# Patient Record
Sex: Male | Born: 2005 | Race: Black or African American | Hispanic: No | Marital: Single | Smoking: Never smoker
Health system: Southern US, Community
[De-identification: ages and names within clinical notes are randomized; demographics above are authoritative.]

## PROBLEM LIST (undated history)

## (undated) DIAGNOSIS — H9325 Central auditory processing disorder: Secondary | ICD-10-CM

## (undated) DIAGNOSIS — R454 Irritability and anger: Secondary | ICD-10-CM

## (undated) DIAGNOSIS — R569 Unspecified convulsions: Secondary | ICD-10-CM

## (undated) DIAGNOSIS — F909 Attention-deficit hyperactivity disorder, unspecified type: Secondary | ICD-10-CM

## (undated) DIAGNOSIS — E669 Obesity, unspecified: Secondary | ICD-10-CM

## (undated) HISTORY — DX: Attention-deficit hyperactivity disorder, unspecified type: F90.9

## (undated) HISTORY — DX: Central auditory processing disorder: H93.25

## (undated) HISTORY — DX: Obesity, unspecified: E66.9

---
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
## 2023-02-12 ENCOUNTER — Ambulatory Visit
Admission: RE | Admit: 2023-02-12 | Discharge: 2023-02-12 | Disposition: A | Discharge status: Home / Self Care | Payer: BC Managed Care – PPO | Source: Ambulatory Visit | Attending: Family Medicine | Admitting: Family Medicine

## 2023-02-12 ENCOUNTER — Ambulatory Visit: Payer: BC Managed Care – PPO

## 2023-02-12 VITALS — BP 161/110 | HR 93 | Temp 97.7°F | Resp 16

## 2023-02-12 DIAGNOSIS — R1030 Lower abdominal pain, unspecified: Secondary | ICD-10-CM | POA: Diagnosis not present

## 2023-02-12 DIAGNOSIS — K219 Gastro-esophageal reflux disease without esophagitis: Secondary | ICD-10-CM | POA: Diagnosis not present

## 2023-02-12 DIAGNOSIS — K5900 Constipation, unspecified: Secondary | ICD-10-CM

## 2023-02-12 DIAGNOSIS — R112 Nausea with vomiting, unspecified: Secondary | ICD-10-CM

## 2023-02-12 MED ORDER — POLYETHYLENE GLYCOL 3350 17 GM/SCOOP PO POWD: 17.0000 g | Freq: Every day | ORAL | 0 refills | Status: DC

## 2023-02-12 MED ORDER — OMEPRAZOLE 40 MG PO CPDR: DELAYED_RELEASE_CAPSULE | ORAL | 1 refills | Status: DC

## 2023-02-12 NOTE — Discharge Instructions (Signed)
Read attached information sheets.  If symptoms become significantly worse during the night or over the weekend, proceed to the local emergency room.

## 2023-02-12 NOTE — ED Provider Notes (Signed)
[facility]    CSN: [id number] Arrival date & time: 02/12/23  1631      History   Chief Complaint Chief Complaint  Patient presents with   Abdominal Pain    Constant pain then when eat or drink get a stronger pain with nausea and feeling of having to go do #2. Haven't eaten fully for 2 days - Entered by patient    HPI Lee Haynes is a 17 y.o. male.   Patient complains of six day history of epigastric pain and sensation of hunger; however, eating causes increased bloating and discomfort. During the past 3 days he has had nausea and occasional vomiting. His last bowel movement was three days ago, although has a frequent urge to have a bowel movement.  Prior to his present symptoms his bowel movements were normal.  He denies melena and hematochezia.  He denies urinary symptoms and fevers, chills, and sweats.  The history is provided by the patient.  Abdominal Pain Pain location:  Epigastric Pain quality: bloating and fullness   Pain radiates to:  Does not radiate Pain severity:  Moderate Onset quality:  Sudden Duration:  6 days Timing:  Intermittent Progression:  Worsening Chronicity:  New Context: awakening from sleep and eating   Context: not diet changes, not previous surgeries, not recent illness, not recent travel, not sick contacts and not suspicious food intake   Relieved by:  Nothing Worsened by:  Eating Ineffective treatments: Pepto-bismol. Associated symptoms: belching, constipation, fatigue, nausea and vomiting   Associated symptoms: no chest pain, no chills, no cough, no diarrhea, no dysuria, no fever, no flatus, no hematemesis, no hematochezia, no hematuria, no melena and no shortness of breath   Risk factors: obesity     Past Medical History:  Diagnosis Date   ADHD (attention deficit hyperactivity disorder)    Anger    Central auditory processing disorder (CAPD)    Obesity    Seizures (HCC)    Had a single seizure 04/2009    Patient  Active Problem List   Diagnosis Date Noted   On methotrexate therapy 08/28/2020   Seborrheic dermatitis 08/28/2020   Essential hypertension 10/17/2019   Insulin resistance 08/20/2018   Mixed hyperlipidemia 08/20/2018   Peritonsillitis 04/11/2014   Acanthosis nigricans 07/20/2013   Attention deficit hyperactivity disorder (ADHD) 07/13/2013   Intermittent explosive disorder 07/13/2013    History reviewed. No pertinent surgical history.     Home Medications    Prior to Admission medications   Medication Sig Start Date End Date Taking? Authorizing Provider  omeprazole (PRILOSEC) 40 MG capsule Take one cap PO once daily before dinner 02/12/23  Yes Lashan Macias, Tera Mater, MD  polyethylene glycol powder (MIRALAX) 17 GM/SCOOP powder Take 17 g by mouth daily. Mix in 4 - 8 oz of liquid. 02/12/23  Yes Lattie Haw, MD  clobetasol (TEMOVATE) 0.05 % external solution Apply topically to affected areas of SCALP, not hair, daily as needed (when rough, scaly patches are present). DO NOT USE ON FACE. 08/28/20   [provider]  desonide (DESOWEN) 0.05 % ointment Apply topically. 08/31/20   [provider]  folic acid (FOLVITE) 1 MG tablet Take 1 mg by mouth daily. Patient not taking: Reported on 05/08/2021 08/28/20   [provider]  hydrocortisone 2.5 % ointment  04/14/18   [provider]  ketoconazole (NIZORAL) 2 % shampoo  04/14/18   [provider]  methotrexate (RHEUMATREX) 2.5 MG tablet Take 3 tablets (7.5 mg)  on Sunday and 3 tablets (7.5 mg) on Monday for a weekly total of 15 mg. Patient not taking: Reported on 05/08/2021 08/28/20   [provider]  Omega-3 Fatty Acids (FISH OIL PO) Take by mouth. Patient not taking: Reported on 05/08/2021    [provider]  sertraline (ZOLOFT) 100 MG tablet  12/04/17   [provider]  sertraline (ZOLOFT) 50 MG tablet Take 50 mg by mouth daily.  Patient not taking: Reported on 10/17/2019 08/19/15    [provider]  trimethoprim-polymyxin b (POLYTRIM) ophthalmic solution 1 drop 3 (three) times daily. Patient not taking: Reported on 05/08/2021 08/17/20   [provider]    Family History Family History  Problem Relation Age of Onset   Cancer Paternal Grandmother        Died at 21   Stroke Maternal Grandfather    Stroke Maternal Grandmother        Died at 53   Depression Maternal Aunt    Paranoid behavior Maternal Aunt        Paranoid Schizophrenia   Learning disabilities Brother        Half brother   Developmental delay Brother        Half brother    Healthy Mother    Diabetes Father        T2DM, treated with insulin.  On dialysis, awaiting kidney transplant   Diabetes Paternal Grandfather    Autism Cousin        First cousin    Diabetes Paternal Aunt    Diabetes Paternal Uncle    Thyroid disease Neg Hx     Social History Social History   Tobacco Use   Smoking status: Never    Passive exposure: Yes   Smokeless tobacco: Never     Allergies   Patient has no known allergies.   Review of Systems Review of Systems  Constitutional:  Positive for activity change, appetite change and fatigue. Negative for chills, diaphoresis and fever.  HENT: Negative.    Eyes: Negative.   Respiratory:  Negative for cough and shortness of breath.   Cardiovascular:  Negative for chest pain.  Gastrointestinal:  Positive for abdominal pain, constipation, nausea and vomiting. Negative for diarrhea, flatus, hematemesis, hematochezia and melena.  Genitourinary:  Negative for dysuria and hematuria.  Musculoskeletal: Negative.   Skin: Negative.   Neurological:  Negative for headaches.  Hematological:  Negative for adenopathy.     Physical Exam Triage Vital Signs ED Triage Vitals  Encounter Vitals Group     BP 02/12/23 1718 (!) 161/110     Systolic BP Percentile --      Diastolic BP Percentile --      Pulse Rate 02/12/23 1718 93     Resp 02/12/23 1718 16      Temp 02/12/23 1718 97.7 F (36.5 C)     Temp src --      SpO2 02/12/23 1718 98 %     Weight --      Height --      Head Circumference --      Peak Flow --      Pain Score 02/12/23 1717 4     Pain Loc --      Pain Education --      Exclude from Growth Chart --    No data found.  Updated Vital Signs BP (!) 161/110   Pulse 93   Temp 97.7 F (36.5 C)   Resp 16   SpO2 98%  Visual Acuity Right Eye Distance:   Left Eye Distance:   Bilateral Distance:    Right Eye Near:   Left Eye Near:    Bilateral Near:     Physical Exam Vitals and nursing note reviewed.  Constitutional:      General: He is not in acute distress.    Appearance: He is not ill-appearing.  HENT:     Head: Normocephalic.     Mouth/Throat:     Mouth: Mucous membranes are moist.     Pharynx: Oropharynx is clear.  Eyes:     Extraocular Movements: Extraocular movements intact.     Conjunctiva/sclera: Conjunctivae normal.     Pupils: Pupils are equal, round, and reactive to light.  Cardiovascular:     Rate and Rhythm: Normal rate and regular rhythm.     Heart sounds: Normal heart sounds.  Pulmonary:     Effort: Pulmonary effort is normal.     Breath sounds: Normal breath sounds.  Abdominal:     General: Abdomen is protuberant. Bowel sounds are decreased. There is no distension.     Palpations: Abdomen is soft. There is no hepatomegaly, splenomegaly or mass.     Tenderness: There is abdominal tenderness in the epigastric area. There is no right CVA tenderness or left CVA tenderness. Negative signs include Murphy's sign.    Musculoskeletal:     Cervical back: Neck supple.     Right lower leg: No edema.     Left lower leg: No edema.  Lymphadenopathy:     Cervical: No cervical adenopathy.  Skin:    General: Skin is warm and dry.     Findings: No rash.  Neurological:     Mental Status: He is alert and oriented to person, place, and time.     UC Treatments / Results  Labs (all labs ordered are  listed, but only abnormal results are displayed) Labs Reviewed - No data to display  EKG   Radiology DG Abd 2 Views  Result Date: 02/12/2023 CLINICAL DATA:  Abdomen pain nausea and vomiting EXAM: ABDOMEN - 2 VIEW COMPARISON:  02/14/2014 FINDINGS: No free air beneath the diaphragm. Nonobstructed gas pattern with moderate stool burden. No radiopaque calculi. IMPRESSION: Nonobstructed gas pattern with moderate stool burden. Electronically Signed   By: Jasmine Pang M.D.   On: 02/12/2023 19:42    Procedures Procedures (including critical care time)  Medications Ordered in UC Medications - No data to display  Initial Impression / Assessment and Plan / UC Course  I have reviewed the triage vital signs and the nursing notes.  Pertinent labs & imaging results that were available during my care of the patient were reviewed by me and considered in my medical decision making (see chart for details).    2 view abdomen shows nonobstructed gas pattern with moderate stool burden. Suspect epigastric symptoms represent GERD. Begin omeprazole 40mg  daily and Miralax. Followup with Family Doctor if not improved in about two weeks.  Final Clinical Impressions(s) / UC Diagnoses   Final diagnoses:  Gastroesophageal reflux disease, unspecified whether esophagitis present  Constipation, unspecified constipation type     Discharge Instructions      Read attached information sheets.  If symptoms become significantly worse during the night or over the weekend, proceed to the local emergency room.      ED Prescriptions     Medication Sig Dispense Auth. Provider   omeprazole (PRILOSEC) 40 MG capsule Take one cap PO once daily before dinner 30  capsule Lattie Haw, MD   polyethylene glycol powder (MIRALAX) 17 GM/SCOOP powder Take 17 g by mouth daily. Mix in 4 - 8 oz of liquid. 255 g Lattie Haw, MD         Lattie Haw, MD 02/13/23 [id number]

## 2023-02-12 NOTE — ED Triage Notes (Signed)
Pt presents to uc with co of abd pain epigastric and left sided nausea, vomiting and decreased appetite for 4 days. Pt reports pepto. Pt reports no bowel movements since Monday.

## 2023-02-14 ENCOUNTER — Encounter (HOSPITAL_COMMUNITY): Payer: Self-pay

## 2023-02-14 ENCOUNTER — Inpatient Hospital Stay (HOSPITAL_COMMUNITY)
Admission: EM | Admit: 2023-02-14 | Discharge: 2023-02-19 | DRG: 439 | Disposition: A | Discharge status: Home / Self Care | Payer: BC Managed Care – PPO | Attending: Internal Medicine | Admitting: Internal Medicine

## 2023-02-14 ENCOUNTER — Emergency Department (HOSPITAL_COMMUNITY): Payer: BC Managed Care – PPO

## 2023-02-14 ENCOUNTER — Other Ambulatory Visit: Payer: Self-pay

## 2023-02-14 DIAGNOSIS — L409 Psoriasis, unspecified: Secondary | ICD-10-CM | POA: Diagnosis present

## 2023-02-14 DIAGNOSIS — K859 Acute pancreatitis without necrosis or infection, unspecified: Principal | ICD-10-CM | POA: Diagnosis present

## 2023-02-14 DIAGNOSIS — Z833 Family history of diabetes mellitus: Secondary | ICD-10-CM

## 2023-02-14 DIAGNOSIS — I10 Essential (primary) hypertension: Secondary | ICD-10-CM | POA: Diagnosis present

## 2023-02-14 DIAGNOSIS — R030 Elevated blood-pressure reading, without diagnosis of hypertension: Secondary | ICD-10-CM | POA: Insufficient documentation

## 2023-02-14 DIAGNOSIS — Z823 Family history of stroke: Secondary | ICD-10-CM

## 2023-02-14 DIAGNOSIS — E66813 Obesity, class 3: Secondary | ICD-10-CM | POA: Diagnosis present

## 2023-02-14 DIAGNOSIS — Z79899 Other long term (current) drug therapy: Secondary | ICD-10-CM | POA: Diagnosis not present

## 2023-02-14 DIAGNOSIS — Z6841 Body Mass Index (BMI) 40.0 and over, adult: Secondary | ICD-10-CM | POA: Diagnosis not present

## 2023-02-14 DIAGNOSIS — K8500 Idiopathic acute pancreatitis without necrosis or infection: Principal | ICD-10-CM | POA: Diagnosis present

## 2023-02-14 DIAGNOSIS — F909 Attention-deficit hyperactivity disorder, unspecified type: Secondary | ICD-10-CM | POA: Diagnosis present

## 2023-02-14 DIAGNOSIS — Z818 Family history of other mental and behavioral disorders: Secondary | ICD-10-CM | POA: Diagnosis not present

## 2023-02-14 LAB — CBC
HCT: 43.8 % (ref 39.0–52.0)
Hemoglobin: 15.1 g/dL (ref 13.0–17.0)
MCH: 28 pg (ref 26.0–34.0)
MCHC: 34.5 g/dL (ref 30.0–36.0)
MCV: 81.3 fL (ref 80.0–100.0)
Platelets: 319 10*3/uL (ref 150–400)
RBC: 5.39 MIL/uL (ref 4.22–5.81)
RDW: 12.6 % (ref 11.5–15.5)
WBC: 13.7 10*3/uL — ABNORMAL HIGH (ref 4.0–10.5)
nRBC: 0 % (ref 0.0–0.2)

## 2023-02-14 LAB — URINALYSIS, ROUTINE W REFLEX MICROSCOPIC
Bilirubin Urine: NEGATIVE
Glucose, UA: NEGATIVE mg/dL
Hgb urine dipstick: NEGATIVE
Ketones, ur: 20 mg/dL — AB
Leukocytes,Ua: NEGATIVE
Nitrite: NEGATIVE
Protein, ur: 100 mg/dL — AB
Specific Gravity, Urine: 1.029 (ref 1.005–1.030)
pH: 5 (ref 5.0–8.0)

## 2023-02-14 LAB — COMPREHENSIVE METABOLIC PANEL
ALT: 25 U/L (ref 0–44)
AST: 15 U/L (ref 15–41)
Albumin: 4.1 g/dL (ref 3.5–5.0)
Alkaline Phosphatase: 77 U/L (ref 38–126)
Anion gap: 11 (ref 5–15)
BUN: 10 mg/dL (ref 6–20)
CO2: 22 mmol/L (ref 22–32)
Calcium: 9.2 mg/dL (ref 8.9–10.3)
Chloride: 102 mmol/L (ref 98–111)
Creatinine, Ser: 1.03 mg/dL (ref 0.61–1.24)
GFR, Estimated: >60 mL/min (ref >60)
Glucose, Bld: 86 mg/dL (ref 70–99)
Potassium: 3.8 mmol/L (ref 3.5–5.1)
Sodium: 135 mmol/L (ref 135–145)
Total Bilirubin: 1 mg/dL (ref <1.2)
Total Protein: 8.3 g/dL — ABNORMAL HIGH (ref 6.5–8.1)

## 2023-02-14 LAB — TRIGLYCERIDES: Triglycerides: 76 mg/dL (ref <150)

## 2023-02-14 LAB — LIPASE, BLOOD: Lipase: 63 U/L — ABNORMAL HIGH (ref 11–51)

## 2023-02-14 MED ORDER — ONDANSETRON HCL 4 MG/2ML IJ SOLN
4.0000 mg | Freq: Once | INTRAMUSCULAR | Status: AC
  Administered 2023-02-14: 4 mg via INTRAVENOUS
  Filled 2023-02-14: qty 2

## 2023-02-14 MED ORDER — OXYCODONE HCL 5 MG PO TABS
5.0000 mg | ORAL_TABLET | ORAL | Status: DC | PRN
  Filled 2023-02-14: qty 1

## 2023-02-14 MED ORDER — MORPHINE SULFATE (PF) 4 MG/ML IV SOLN
6.0000 mg | Freq: Once | INTRAVENOUS | Status: AC
  Administered 2023-02-14: 6 mg via INTRAVENOUS
  Filled 2023-02-14: qty 2

## 2023-02-14 MED ORDER — PANTOPRAZOLE SODIUM 40 MG IV SOLR
40.0000 mg | Freq: Once | INTRAVENOUS | Status: AC
  Administered 2023-02-14: 40 mg via INTRAVENOUS
  Filled 2023-02-14: qty 10

## 2023-02-14 MED ORDER — ENOXAPARIN SODIUM 80 MG/0.8ML IJ SOSY
80.0000 mg | PREFILLED_SYRINGE | INTRAMUSCULAR | Status: DC
  Administered 2023-02-14 – 2023-02-18 (×5): 80 mg via SUBCUTANEOUS
  Filled 2023-02-14 (×5): qty 0.8

## 2023-02-14 MED ORDER — ONDANSETRON HCL 4 MG/2ML IJ SOLN
4.0000 mg | Freq: Four times a day (QID) | INTRAMUSCULAR | Status: DC | PRN
  Administered 2023-02-15 – 2023-02-17 (×4): 4 mg via INTRAVENOUS
  Filled 2023-02-14 (×5): qty 2

## 2023-02-14 MED ORDER — SODIUM CHLORIDE 0.9 % IV SOLN: INTRAVENOUS | Status: AC

## 2023-02-14 MED ORDER — ONDANSETRON HCL 4 MG PO TABS: 4.0000 mg | ORAL_TABLET | Freq: Four times a day (QID) | ORAL | Status: DC | PRN

## 2023-02-14 MED ORDER — IOHEXOL 300 MG/ML  SOLN
100.0000 mL | Freq: Once | INTRAMUSCULAR | Status: AC | PRN
  Administered 2023-02-14: 100 mL via INTRAVENOUS

## 2023-02-14 MED ORDER — HYDROMORPHONE HCL 1 MG/ML IJ SOLN
0.5000 mg | INTRAMUSCULAR | Status: DC | PRN
  Administered 2023-02-14 – 2023-02-18 (×22): 1 mg via INTRAVENOUS
  Administered 2023-02-19 (×2): 0.5 mg via INTRAVENOUS
  Filled 2023-02-14 (×25): qty 1

## 2023-02-14 MED ORDER — SODIUM CHLORIDE 0.9 % IV SOLN: INTRAVENOUS | Status: DC

## 2023-02-14 MED ORDER — ACETAMINOPHEN 650 MG RE SUPP: 650.0000 mg | Freq: Four times a day (QID) | RECTAL | Status: DC | PRN

## 2023-02-14 MED ORDER — ACETAMINOPHEN 325 MG PO TABS: 650.0000 mg | ORAL_TABLET | Freq: Four times a day (QID) | ORAL | Status: DC | PRN

## 2023-02-14 MED ORDER — LABETALOL HCL 5 MG/ML IV SOLN
10.0000 mg | INTRAVENOUS | Status: DC | PRN
  Administered 2023-02-15: 10 mg via INTRAVENOUS
  Filled 2023-02-14 (×2): qty 4

## 2023-02-14 MED ORDER — HYDROMORPHONE HCL 1 MG/ML IJ SOLN
1.0000 mg | Freq: Once | INTRAMUSCULAR | Status: AC
  Administered 2023-02-14: 1 mg via INTRAVENOUS
  Filled 2023-02-14: qty 1

## 2023-02-14 MED ORDER — SODIUM CHLORIDE 0.9 % IV BOLUS
1000.0000 mL | Freq: Once | INTRAVENOUS | Status: AC
  Administered 2023-02-14: 1000 mL via INTRAVENOUS

## 2023-02-14 NOTE — H&P (Signed)
History and Physical    Lee Haynes ZOX:[id number] DOB: Jul 16, 2005 DOA: 02/14/2023  PCP: Carmin Richmond, MD   Patient coming from: Home   Chief Complaint: Abdominal pain, N/V   HPI: Lee Haynes is a 17 y.o. male with medical history significant for psoriasis, ADHD, and BMI 50 who presents with abdominal pain, nausea, and vomiting.   Patient reports close to 1 week of epigastric pain with N/V.  Symptoms come and go, are much worse after eating, and overall becoming more frequent and severe. He has never experienced this previously. He denies alcohol use. There is no diarrhea, fever, or chills associated with this.   ED Course: Upon arrival to the ED, patient is found to be afebrile and saturating well on room air with stable blood pressure.  Labs are most notable for WBC 13,700 and lipase 63.  CT reveals edematous pancreas with soft tissue stranding.  Patient was given a liter of normal saline, Zofran, IV Protonix, Dilaudid, and morphine in the ED.  Review of Systems:  All other systems reviewed and apart from HPI, are negative.  Past Medical History:  Diagnosis Date   ADHD (attention deficit hyperactivity disorder)    Anger    Central auditory processing disorder (CAPD)    Obesity    Seizures (HCC)    Had a single seizure 04/2009    History reviewed. No pertinent surgical history.  Social History:   reports that he has never smoked. He has been exposed to tobacco smoke. He has never used smokeless tobacco. No history on file for alcohol use and drug use.  No Known Allergies  Family History  Problem Relation Age of Onset   Cancer Paternal Grandmother        Died at 3   Stroke Maternal Grandfather    Stroke Maternal Grandmother        Died at 87   Depression Maternal Aunt    Paranoid behavior Maternal Aunt        Paranoid Schizophrenia   Learning disabilities Brother        Half brother   Developmental delay Brother        Half brother    Healthy Mother     Diabetes Father        T2DM, treated with insulin.  On dialysis, awaiting kidney transplant   Diabetes Paternal Grandfather    Autism Cousin        First cousin    Diabetes Paternal Aunt    Diabetes Paternal Uncle    Thyroid disease Neg Hx      Prior to Admission medications   Medication Sig Start Date End Date Taking? Authorizing Provider  clobetasol (TEMOVATE) 0.05 % external solution Apply topically to affected areas of SCALP, not hair, daily as needed (when rough, scaly patches are present). DO NOT USE ON FACE. 08/28/20   [provider]  desonide (DESOWEN) 0.05 % ointment Apply topically. 08/31/20   [provider]  folic acid (FOLVITE) 1 MG tablet Take 1 mg by mouth daily. Patient not taking: Reported on 05/08/2021 08/28/20   [provider]  hydrocortisone 2.5 % ointment  04/14/18   [provider]  ketoconazole (NIZORAL) 2 % shampoo  04/14/18   [provider]  methotrexate (RHEUMATREX) 2.5 MG tablet Take 3 tablets (7.5 mg) on Sunday and 3 tablets (7.5 mg) on Monday for a weekly total of 15 mg. Patient not taking: Reported on 05/08/2021 08/28/20   [provider]  Omega-3 Fatty Acids (FISH OIL PO) Take by mouth. Patient not taking: Reported on 05/08/2021    [provider]  omeprazole (PRILOSEC) 40 MG capsule Take one cap PO once daily before dinner 02/12/23   Lattie Haw, MD  polyethylene glycol powder (MIRALAX) 17 GM/SCOOP powder Take 17 g by mouth daily. Mix in 4 - 8 oz of liquid. 02/12/23   Lattie Haw, MD  sertraline (ZOLOFT) 100 MG tablet  12/04/17   [provider]  sertraline (ZOLOFT) 50 MG tablet Take 50 mg by mouth daily.  Patient not taking: Reported on 10/17/2019 08/19/15   [provider]  trimethoprim-polymyxin b (POLYTRIM) ophthalmic solution 1 drop 3 (three) times daily. Patient not taking: Reported on 05/08/2021 08/17/20   [provider]    Physical Exam: Vitals:    02/14/23 1316 02/14/23 1320 02/14/23 1400 02/14/23 1500  BP: (!) 158/70  (!) 157/82 (!) 145/85  Pulse: (!) 108  94 76  Resp: 16   20  Temp: 99.5 F (37.5 C)     TempSrc: Oral     SpO2: 97%  100% 100%  Weight:  (!) 176 kg    Height:  6\' 2"  (1.88 m)       Constitutional: NAD, calm  Eyes: PERTLA, lids and conjunctivae normal ENMT: Mucous membranes are moist. Posterior pharynx clear of any exudate or lesions.   Neck: supple, no masses  Respiratory: clear to auscultation bilaterally, no wheezing, no crackles. No accessory muscle use.  Cardiovascular: S1 & S2 heard, regular rate and rhythm. No extremity edema. No significant JVD. Abdomen: No distension, no tenderness, soft. Bowel sounds active.  Musculoskeletal: no clubbing / cyanosis. No joint deformity upper and lower extremities.   Skin: no significant rashes, lesions, ulcers. Warm, dry, well-perfused. Neurologic: CN 2-12 grossly intact. Sensation intact, DTR normal. Strength 5/5 in all 4 limbs. Alert and oriented.  Psychiatric: Pleasant. Cooperative.    Labs and Imaging on Admission: I have personally reviewed following labs and imaging studies  CBC: Recent Labs  Lab 02/14/23 1336  WBC 13.7*  HGB 15.1  HCT 43.8  MCV 81.3  PLT 319   Basic Metabolic Panel: Recent Labs  Lab 02/14/23 1507  NA 135  K 3.8  CL 102  CO2 22  GLUCOSE 86  BUN 10  CREATININE 1.03  CALCIUM 9.2   GFR: Estimated Creatinine Clearance: 195.3 mL/min (by C-G formula based on SCr of 1.03 mg/dL). Liver Function Tests: Recent Labs  Lab 02/14/23 1507  AST 15  ALT 25  ALKPHOS 77  BILITOT 1.0  PROT 8.3*  ALBUMIN 4.1   Recent Labs  Lab 02/14/23 1507  LIPASE 63*   No results for input(s): "AMMONIA" in the last 168 hours. Coagulation Profile: No results for input(s): "INR", "PROTIME" in the last 168 hours. Cardiac Enzymes: No results for input(s): "CKTOTAL", "CKMB", "CKMBINDEX", "TROPONINI" in the last 168 hours. BNP (last 3 results) No  results for input(s): "PROBNP" in the last 8760 hours. HbA1C: No results for input(s): "HGBA1C" in the last 72 hours. CBG: No results for input(s): "GLUCAP" in the last 168 hours. Lipid Profile: No results for input(s): "CHOL", "HDL", "LDLCALC", "TRIG", "CHOLHDL", "LDLDIRECT" in the last 72 hours. Thyroid Function Tests: No results for input(s): "TSH", "T4TOTAL", "FREET4", "T3FREE", "THYROIDAB" in the last 72 hours. Anemia Panel: No results for input(s): "VITAMINB12", "FOLATE", "FERRITIN", "TIBC", "IRON", "RETICCTPCT" in the last 72 hours. Urine analysis:    Component Value Date/Time   COLORURINE AMBER (A)  02/14/2023 1348   APPEARANCEUR CLEAR 02/14/2023 1348   LABSPEC 1.029 02/14/2023 1348   PHURINE 5.0 02/14/2023 1348   GLUCOSEU NEGATIVE 02/14/2023 1348   HGBUR NEGATIVE 02/14/2023 1348   BILIRUBINUR NEGATIVE 02/14/2023 1348   KETONESUR 20 (A) 02/14/2023 1348   PROTEINUR 100 (A) 02/14/2023 1348   UROBILINOGEN 1.0 04/17/2009 1027   NITRITE NEGATIVE 02/14/2023 1348   LEUKOCYTESUR NEGATIVE 02/14/2023 1348   Sepsis Labs: @LABRCNTIP (procalcitonin:4,lacticidven:4) )No results found for this or any previous visit (from the past 240 hour(s)).   Radiological Exams on Admission: CT ABDOMEN PELVIS W CONTRAST  Result Date: 02/14/2023 CLINICAL DATA:  Abdominal pain.  Nausea and vomiting for 6 days. EXAM: CT ABDOMEN AND PELVIS WITH CONTRAST TECHNIQUE: Multidetector CT imaging of the abdomen and pelvis was performed using the standard protocol following bolus administration of intravenous contrast. RADIATION DOSE REDUCTION: This exam was performed according to the departmental dose-optimization program which includes automated exposure control, adjustment of the mA and/or kV according to patient size and/or use of iterative reconstruction technique. CONTRAST:  OMNIPAQUE IOHEXOL 300 MG/ML  SOLN COMPARISON:  None Available. FINDINGS: Lower chest: No acute abnormality. Hepatobiliary: No focal  liver abnormality is seen. No gallstones, gallbladder wall thickening, or biliary dilatation. Pancreas: The pancreas appears edematous. There is mild diffuse soft tissue stranding about the pancreatic tail. No signs of main duct dilatation, necrosis, pseudocyst or mass. Spleen: Normal in size without focal abnormality. Adrenals/Urinary Tract: Adrenal glands are unremarkable. Kidneys are normal, without renal calculi, focal lesion, or hydronephrosis. Bladder is unremarkable. Stomach/Bowel: Stomach appears normal. No dilated loops of large or small bowel. No bowel wall thickening or inflammation identified. Vascular/Lymphatic: The abdominal vascularity is patent. Normal caliber of the abdominal aorta. No signs of abdominopelvic adenopathy. Reproductive: Prostate is unremarkable. Other: No free fluid or fluid collections identified. No signs of pneumoperitoneum. Musculoskeletal: No acute or significant osseous findings. IMPRESSION: The pancreas appears edematous with mild diffuse soft tissue stranding about the pancreatic tail. Findings are compatible with acute pancreatitis. No signs of main duct dilatation, necrosis, pseudocyst or mass. Electronically Signed   By: Signa Kell M.D.   On: 02/14/2023 16:06   DG Abd 2 Views  Result Date: 02/12/2023 CLINICAL DATA:  Abdomen pain nausea and vomiting EXAM: ABDOMEN - 2 VIEW COMPARISON:  02/14/2014 FINDINGS: No free air beneath the diaphragm. Nonobstructed gas pattern with moderate stool burden. No radiopaque calculi. IMPRESSION: Nonobstructed gas pattern with moderate stool burden. Electronically Signed   By: Jasmine Pang M.D.   On: 02/12/2023 19:42     Assessment/Plan   1. Acute pancreatitis  - Check triglycerides, continue bowel-rest, IVF hydration, and pain-control   2. HTN  - Treat as-needed only for now     DVT prophylaxis: Lovenox  Code Status: Full  Level of Care: Level of care: Med-Surg Family Communication: Mother at bedside   Disposition  Plan:  Patient is from: home  Anticipated d/c is to: Home  Anticipated d/c date is: 02/17/23  Patient currently: Pending pain-control, tolerance of adequate oral intake  Consults called: None  Admission status: Inpatient     Briscoe Deutscher, MD [facility]  02/14/2023, 5:21 PM

## 2023-02-14 NOTE — ED Triage Notes (Signed)
Reports epigastric pain, nausea, and vomiting x 6 days. Seen at urgent care 2 days ago, was dx with GERD and constipation. Prescribed omeprazole and miralax. Has been unable to take d/t nausea. Patient also has not had a bowel movement.

## 2023-02-14 NOTE — ED Provider Notes (Addendum)
[place] EMERGENC[facility] DEPARTMENT AT [facility] Provider Note   CSN: [id number] Arrival date & time: 02/14/23  1309     History  Chief Complaint  Patient presents with   Abdominal Pain    Lee Haynes is a 17 y.o. male.  17year-old male presents with 6 days of abdominal pain.  Pain has been epigastric in nature.  Is worse when he takes anything orally.  History of reflux in the past but is using not associated with pain.  No urinary symptoms.  No use of cannabis.  No fever or chills.  Denies any diarrhea.  Seen at urgent care 2 days ago for similar symptoms.  Plain x-rays performed and only showed increased stool.  Mom notes patient has had good bowel movements.  No prior history of abdominal surgeries       Home Medications Prior to Admission medications   Medication Sig Start Date End Date Taking? Authorizing Provider  clobetasol (TEMOVATE) 0.05 % external solution Apply topically to affected areas of SCALP, not hair, daily as needed (when rough, scaly patches are present). DO NOT USE ON FACE. 08/28/20   [provider]  desonide (DESOWEN) 0.05 % ointment Apply topically. 08/31/20   [provider]  folic acid (FOLVITE) 1 MG tablet Take 1 mg by mouth daily. Patient not taking: Reported on 05/08/2021 08/28/20   [provider]  hydrocortisone 2.5 % ointment  04/14/18   [provider]  ketoconazole (NIZORAL) 2 % shampoo  04/14/18   [provider]  methotrexate (RHEUMATREX) 2.5 MG tablet Take 3 tablets (7.5 mg) on Sunday and 3 tablets (7.5 mg) on Monday for a weekly total of 15 mg. Patient not taking: Reported on 05/08/2021 08/28/20   [provider]  Omega-3 Fatty Acids (FISH OIL PO) Take by mouth. Patient not taking: Reported on 05/08/2021    [provider]  omeprazole (PRILOSEC) 40 MG capsule Take one cap PO once daily before dinner 02/12/23   Lattie Haw, MD  polyethylene glycol powder (MIRALAX) 17  GM/SCOOP powder Take 17 g by mouth daily. Mix in 4 - 8 oz of liquid. 02/12/23   Lattie Haw, MD  sertraline (ZOLOFT) 100 MG tablet  12/04/17   [provider]  sertraline (ZOLOFT) 50 MG tablet Take 50 mg by mouth daily.  Patient not taking: Reported on 10/17/2019 08/19/15   [provider]  trimethoprim-polymyxin b (POLYTRIM) ophthalmic solution 1 drop 3 (three) times daily. Patient not taking: Reported on 05/08/2021 08/17/20   [provider]      Allergies    Patient has no known allergies.    Review of Systems   Review of Systems  All other systems reviewed and are negative.   Physical Exam Updated Vital Signs BP (!) 158/70 (BP Location: Left Arm)   Pulse (!) 108   Temp 99.5 F (37.5 C) (Oral)   Resp 16   Ht 1.88 m (6\' 2" )   Wt (!) 176 kg   SpO2 97%   BMI 49.82 kg/m  Physical Exam Vitals and nursing note reviewed.  Constitutional:      General: He is not in acute distress.    Appearance: Normal appearance. He is well-developed. He is not toxic-appearing.  HENT:     Head: Normocephalic and atraumatic.  Eyes:     General: Lids are normal.     Conjunctiva/sclera: Conjunctivae normal.     Pupils: Pupils are equal, round, and reactive to light.  Neck:     Thyroid: No thyroid mass.     Trachea: No tracheal deviation.  Cardiovascular:     Rate and Rhythm: Normal rate and regular rhythm.     Heart sounds: Normal heart sounds. No murmur heard.    No gallop.  Pulmonary:     Effort: Pulmonary effort is normal. No respiratory distress.     Breath sounds: Normal breath sounds. No stridor. No decreased breath sounds, wheezing, rhonchi or rales.  Abdominal:     General: There is no distension.     Palpations: Abdomen is soft.     Tenderness: There is abdominal tenderness in the epigastric area. There is no rebound.    Musculoskeletal:        General: No tenderness. Normal range of motion.     Cervical back: Normal range of motion and neck supple.   Skin:    General: Skin is warm and dry.     Findings: No abrasion or rash.  Neurological:     Mental Status: He is alert and oriented to person, place, and time. Mental status is at baseline.     GCS: GCS eye subscore is 4. GCS verbal subscore is 5. GCS motor subscore is 6.     Cranial Nerves: Cranial nerves are intact. No cranial nerve deficit.     Sensory: No sensory deficit.     Motor: Motor function is intact.  Psychiatric:        Attention and Perception: Attention normal.        Speech: Speech normal.        Behavior: Behavior normal.     ED Results / Procedures / Treatments   Labs (all labs ordered are listed, but only abnormal results are displayed) Labs Reviewed  CBC - Abnormal; Notable for the following components:      Result Value   WBC 13.7 (*)    All other components within normal limits  LIPASE, BLOOD  COMPREHENSIVE METABOLIC PANEL  URINALYSIS, ROUTINE W REFLEX MICROSCOPIC    EKG None  Radiology DG Abd 2 Views  Result Date: 02/12/2023 CLINICAL DATA:  Abdomen pain nausea and vomiting EXAM: ABDOMEN - 2 VIEW COMPARISON:  02/14/2014 FINDINGS: No free air beneath the diaphragm. Nonobstructed gas pattern with moderate stool burden. No radiopaque calculi. IMPRESSION: Nonobstructed gas pattern with moderate stool burden. Electronically Signed   By: Jasmine Pang M.D.   On: 02/12/2023 19:42    Procedures Procedures    Medications Ordered in ED Medications  0.9 %  sodium chloride infusion (has no administration in time range)  sodium chloride 0.9 % bolus 1,000 mL (has no administration in time range)  morphine (PF) 4 MG/ML injection 6 mg (has no administration in time range)  ondansetron (ZOFRAN) injection 4 mg (has no administration in time range)    ED Course/ Medical Decision Making/ A&P                                 Medical Decision Making Amount and/or Complexity of Data Reviewed Labs: ordered. Radiology: ordered.  Risk Prescription drug  management.   Patient medicated here with morphine and Zofran.  Vascular IV fluids.  Reassessed and continues to still be in discomfort.  Given additional 1 mg of IV hydromorphone.  Abdominal CT consistent with pancreatitis.  Lipase is only 63.  Due to continued pain patient will be admitted for pain management.  He will be made  NPO.  Mother at bedside and informed of plan will consult hospitalist team        Final Clinical Impression(s) / ED Diagnoses Final diagnoses:  None    Rx / DC Orders ED Discharge Orders     None         Lorre Nick, MD 02/14/23 1646    Lorre Nick, MD 02/14/23 [phone]

## 2023-02-14 NOTE — ED Notes (Signed)
Report called  

## 2023-02-14 NOTE — Progress Notes (Signed)
Received report from [facility] in the ED.  Pt to transfer to 1308 for continued medical treatment

## 2023-02-15 ENCOUNTER — Inpatient Hospital Stay (HOSPITAL_COMMUNITY): Payer: BC Managed Care – PPO

## 2023-02-15 DIAGNOSIS — K859 Acute pancreatitis without necrosis or infection, unspecified: Secondary | ICD-10-CM

## 2023-02-15 LAB — CBC
HCT: 41.7 % (ref 39.0–52.0)
Hemoglobin: 13.6 g/dL (ref 13.0–17.0)
MCH: 27.7 pg (ref 26.0–34.0)
MCHC: 32.6 g/dL (ref 30.0–36.0)
MCV: 84.9 fL (ref 80.0–100.0)
Platelets: 271 10*3/uL (ref 150–400)
RBC: 4.91 MIL/uL (ref 4.22–5.81)
RDW: 12.8 % (ref 11.5–15.5)
WBC: 12 10*3/uL — ABNORMAL HIGH (ref 4.0–10.5)
nRBC: 0 % (ref 0.0–0.2)

## 2023-02-15 LAB — COMPREHENSIVE METABOLIC PANEL
ALT: 22 U/L (ref 0–44)
AST: 13 U/L — ABNORMAL LOW (ref 15–41)
Albumin: 3.6 g/dL (ref 3.5–5.0)
Alkaline Phosphatase: 70 U/L (ref 38–126)
Anion gap: 11 (ref 5–15)
BUN: 9 mg/dL (ref 6–20)
CO2: 23 mmol/L (ref 22–32)
Calcium: 9.1 mg/dL (ref 8.9–10.3)
Chloride: 102 mmol/L (ref 98–111)
Creatinine, Ser: 0.91 mg/dL (ref 0.61–1.24)
GFR, Estimated: >60 mL/min (ref >60)
Glucose, Bld: 82 mg/dL (ref 70–99)
Potassium: 3.7 mmol/L (ref 3.5–5.1)
Sodium: 136 mmol/L (ref 135–145)
Total Bilirubin: 1.1 mg/dL (ref <1.2)
Total Protein: 7.5 g/dL (ref 6.5–8.1)

## 2023-02-15 LAB — HIV ANTIBODY (ROUTINE TESTING W REFLEX): HIV Screen 4th Generation wRfx: NONREACTIVE

## 2023-02-15 MED ORDER — LABETALOL HCL 5 MG/ML IV SOLN: 10.0000 mg | INTRAVENOUS | Status: DC | PRN

## 2023-02-15 NOTE — Plan of Care (Signed)
?  Problem: Clinical Measurements: ?Goal: Will remain free from infection ?Outcome: Progressing ?  ?

## 2023-02-15 NOTE — Progress Notes (Signed)
PROGRESS NOTE    Lee Haynes  VHQ:[id number] DOB: [birth year] DOA: 02/14/2023 PCP: Carmin Richmond, MD   Brief Narrative:  HPI: Lee Haynes is a 17 y.o. male with medical history significant for psoriasis, ADHD, and BMI 50 who presents with abdominal pain, nausea, and vomiting.    Patient reports close to 1 week of epigastric pain with N/V.  Symptoms come and go, are much worse after eating, and overall becoming more frequent and severe. He has never experienced this previously. He denies alcohol use. There is no diarrhea, fever, or chills associated with this.    ED Course: Upon arrival to the ED, patient is found to be afebrile and saturating well on room air with stable blood pressure.  Labs are most notable for WBC 13,700 and lipase 63.  CT reveals edematous pancreas with soft tissue stranding.   Patient was given a liter of normal saline, Zofran, IV Protonix, Dilaudid, and morphine in the ED.  Assessment & Plan:   Principal Problem:   Acute pancreatitis Active Problems:   Essential hypertension   Scalp psoriasis  Acute idiopathic pancreatitis: Triglyceride normal, CT although showed no gallstones but we will proceed with ultrasound abdomen.  He is not on any medications either.  Pain has improved significantly, has mild tenderness.  Will start on full liquid diet and advance to soft diet later today.  Continue as needed Dilaudid.    Elevated blood pressure: No history of hypertension but blood pressure elevated, could be due to pain.  Continue as needed IV labetalol.  Super morbid obesity type III: Weight loss and diet modification counseled.  DVT prophylaxis: Lovenox   Code Status: Full Code  Family Communication:  None present at bedside.  Plan of care discussed with patient in length and he/she verbalized understanding and agreed with it.  Status is: Inpatient Remains inpatient appropriate because: Advancing diet slowly.   Estimated body mass index is 49.82 kg/m  as calculated from the following:   Height as of this encounter: 6\' 2"  (1.88 m).   Weight as of this encounter: 176 kg.    Nutritional Assessment: Body mass index is 49.82 kg/m.Marland Kitchen Seen by dietician.  I agree with the assessment and plan as outlined below: Nutrition Status:        . Skin Assessment: I have examined the patient's skin and I agree with the wound assessment as performed by the wound care RN as outlined below:    Consultants:  None  Procedures:  None  Antimicrobials:  Anti-infectives (From admission, onward)    None         Subjective: Patient seen and examined.  He states that his pain is improving.  He has no other complaint.  No nausea.  Of note, I smelled smoke from his clots but he denied smoking.  Objective: Vitals:   02/14/23 1822 02/14/23 2044 02/15/23 0252 02/15/23 0543  BP: (!) 163/91 (!) 175/92 (!) 186/90 (!) 161/83  Pulse: 94 96 96 83  Resp: 18 18 18 16   Temp: 98.2 F (36.8 C) 98.2 F (36.8 C) 98.4 F (36.9 C) 98.8 F (37.1 C)  TempSrc: Oral Oral Oral Oral  SpO2: 100% 99% 100% 99%  Weight:      Height:        Intake/Output Summary (Last 24 hours) at 02/15/2023 0828 Last data filed at 02/15/2023 0557 Gross per 24 hour  Intake 1489.58 ml  Output 0 ml  Net 1489.58 ml   [employer]  02/14/23 1320  Weight: (!) 176 kg    Examination:  General exam: Appears calm and comfortable, morbidly obese Respiratory system: Clear to auscultation. Respiratory effort normal. Cardiovascular system: S1 & S2 heard, RRR. No JVD, murmurs, rubs, gallops or clicks. No pedal edema. Gastrointestinal system: Abdomen is nondistended, soft and mild epigastric and right upper quadrant tenderness. No organomegaly or masses felt. Normal bowel sounds heard. Central nervous system: Alert and oriented. No focal neurological deficits. Extremities: Symmetric 5 x 5 power. Skin: No rashes, lesions or ulcers   Data Reviewed: I have personally reviewed  following labs and imaging studies  CBC: Recent Labs  Lab 02/14/23 1336 02/15/23 0434  WBC 13.7* 12.0*  HGB 15.1 13.6  HCT 43.8 41.7  MCV 81.3 84.9  PLT 319 271   Basic Metabolic Panel: Recent Labs  Lab 02/14/23 1507 02/15/23 0434  NA 135 136  K 3.8 3.7  CL 102 102  CO2 22 23  GLUCOSE 86 82  BUN 10 9  CREATININE 1.03 0.91  CALCIUM 9.2 9.1   GFR: Estimated Creatinine Clearance: 221.1 mL/min (by C-G formula based on SCr of 0.91 mg/dL). Liver Function Tests: Recent Labs  Lab 02/14/23 1507 02/15/23 0434  AST 15 13*  ALT 25 22  ALKPHOS 77 70  BILITOT 1.0 1.1  PROT 8.3* 7.5  ALBUMIN 4.1 3.6   Recent Labs  Lab 02/14/23 1507  LIPASE 63*   No results for input(s): "AMMONIA" in the last 168 hours. Coagulation Profile: No results for input(s): "INR", "PROTIME" in the last 168 hours. Cardiac Enzymes: No results for input(s): "CKTOTAL", "CKMB", "CKMBINDEX", "TROPONINI" in the last 168 hours. BNP (last 3 results) No results for input(s): "PROBNP" in the last 8760 hours. HbA1C: No results for input(s): "HGBA1C" in the last 72 hours. CBG: No results for input(s): "GLUCAP" in the last 168 hours. Lipid Profile: Recent Labs    02/14/23 1835  TRIG 76   Thyroid Function Tests: No results for input(s): "TSH", "T4TOTAL", "FREET4", "T3FREE", "THYROIDAB" in the last 72 hours. Anemia Panel: No results for input(s): "VITAMINB12", "FOLATE", "FERRITIN", "TIBC", "IRON", "RETICCTPCT" in the last 72 hours. Sepsis Labs: No results for input(s): "PROCALCITON", "LATICACIDVEN" in the last 168 hours.  No results found for this or any previous visit (from the past 240 hour(s)).   Radiology Studies: CT ABDOMEN PELVIS W CONTRAST  Result Date: 02/14/2023 CLINICAL DATA:  Abdominal pain.  Nausea and vomiting for 6 days. EXAM: CT ABDOMEN AND PELVIS WITH CONTRAST TECHNIQUE: Multidetector CT imaging of the abdomen and pelvis was performed using the standard protocol following bolus  administration of intravenous contrast. RADIATION DOSE REDUCTION: This exam was performed according to the departmental dose-optimization program which includes automated exposure control, adjustment of the mA and/or kV according to patient size and/or use of iterative reconstruction technique. CONTRAST:  OMNIPAQUE IOHEXOL 300 MG/ML  SOLN COMPARISON:  None Available. FINDINGS: Lower chest: No acute abnormality. Hepatobiliary: No focal liver abnormality is seen. No gallstones, gallbladder wall thickening, or biliary dilatation. Pancreas: The pancreas appears edematous. There is mild diffuse soft tissue stranding about the pancreatic tail. No signs of main duct dilatation, necrosis, pseudocyst or mass. Spleen: Normal in size without focal abnormality. Adrenals/Urinary Tract: Adrenal glands are unremarkable. Kidneys are normal, without renal calculi, focal lesion, or hydronephrosis. Bladder is unremarkable. Stomach/Bowel: Stomach appears normal. No dilated loops of large or small bowel. No bowel wall thickening or inflammation identified. Vascular/Lymphatic: The abdominal vascularity is patent. Normal caliber of the abdominal aorta. No  signs of abdominopelvic adenopathy. Reproductive: Prostate is unremarkable. Other: No free fluid or fluid collections identified. No signs of pneumoperitoneum. Musculoskeletal: No acute or significant osseous findings. IMPRESSION: The pancreas appears edematous with mild diffuse soft tissue stranding about the pancreatic tail. Findings are compatible with acute pancreatitis. No signs of main duct dilatation, necrosis, pseudocyst or mass. Electronically Signed   By: Signa Kell M.D.   On: 02/14/2023 16:06    Scheduled Meds:  enoxaparin (LOVENOX) injection  80 mg Subcutaneous Q24H   Continuous Infusions:  sodium chloride 125 mL/hr at 02/15/23 0209     LOS: 1 day   Hughie Closs, MD [facility]  02/15/2023, 8:28 AM   *Please note that this is a verbal dictation  therefore any spelling or grammatical errors are due to the "Dragon [employer] One" system interpretation.  Please page via Amion and do not message via secure chat for urgent patient care matters. Secure chat can be used for non urgent patient care matters.  How to contact the [facility] Attending or Consulting provider 7A - 7P or covering provider during after hours 7P -7A, for this patient?  Check the care team in [facility] and look for a) attending/consulting [facility] provider listed and b) the [facility] team listed. Page or secure chat 7A-7P. Log into [email or web address] and use [place]'s universal password to access. If you do not have the password, please contact the hospital operator. Locate the [facility] provider you are looking for under [facility] and page to a number that you can be directly reached. If you still have difficulty reaching the provider, please page the [facility] (Director on Call) for the Hospitalists listed on amion for assistance.

## 2023-02-16 DIAGNOSIS — K859 Acute pancreatitis without necrosis or infection, unspecified: Secondary | ICD-10-CM | POA: Diagnosis not present

## 2023-02-16 DIAGNOSIS — R030 Elevated blood-pressure reading, without diagnosis of hypertension: Secondary | ICD-10-CM

## 2023-02-16 LAB — COMPREHENSIVE METABOLIC PANEL
ALT: 20 U/L (ref 0–44)
AST: 13 U/L — ABNORMAL LOW (ref 15–41)
Albumin: 3.6 g/dL (ref 3.5–5.0)
Alkaline Phosphatase: 67 U/L (ref 38–126)
Anion gap: 9 (ref 5–15)
BUN: 8 mg/dL (ref 6–20)
CO2: 24 mmol/L (ref 22–32)
Calcium: 9 mg/dL (ref 8.9–10.3)
Chloride: 101 mmol/L (ref 98–111)
Creatinine, Ser: 0.9 mg/dL (ref 0.61–1.24)
GFR, Estimated: >60 mL/min (ref >60)
Glucose, Bld: 82 mg/dL (ref 70–99)
Potassium: 3.8 mmol/L (ref 3.5–5.1)
Sodium: 134 mmol/L — ABNORMAL LOW (ref 135–145)
Total Bilirubin: 1 mg/dL (ref <1.2)
Total Protein: 7.8 g/dL (ref 6.5–8.1)

## 2023-02-16 LAB — CBC
HCT: 40.7 % (ref 39.0–52.0)
Hemoglobin: 13.6 g/dL (ref 13.0–17.0)
MCH: 28 pg (ref 26.0–34.0)
MCHC: 33.4 g/dL (ref 30.0–36.0)
MCV: 83.7 fL (ref 80.0–100.0)
Platelets: 277 10*3/uL (ref 150–400)
RBC: 4.86 MIL/uL (ref 4.22–5.81)
RDW: 12.4 % (ref 11.5–15.5)
WBC: 9.5 10*3/uL (ref 4.0–10.5)
nRBC: 0 % (ref 0.0–0.2)

## 2023-02-16 MED ORDER — BOOST / RESOURCE BREEZE PO LIQD CUSTOM
1.0000 | Freq: Three times a day (TID) | ORAL | Status: DC
  Administered 2023-02-16 – 2023-02-17 (×3): 1 via ORAL

## 2023-02-16 MED ORDER — B COMPLEX-C PO TABS
1.0000 | ORAL_TABLET | Freq: Every day | ORAL | Status: DC
  Administered 2023-02-16 – 2023-02-19 (×4): 1 via ORAL
  Filled 2023-02-16 (×4): qty 1

## 2023-02-16 NOTE — Progress Notes (Signed)
Initial Nutrition Assessment  DOCUMENTATION CODES:   Morbid obesity  INTERVENTION:   -Boost Breeze po TID, each supplement provides 250 kcal and 9 grams of protein   -Needs updated weight, current weight recorded is stated  -B complex w/ Vitamin C daily  Once diet advanced:  -Ensure MAX Protein po TID, each supplement provides 150 kcal and 30 grams of protein   -Placed Pancreatic nutrition therapy handout in AVS  NUTRITION DIAGNOSIS:   Increased nutrient needs related to acute illness as evidenced by estimated needs.  GOAL:   Patient will meet greater than or equal to 90% of their needs  MONITOR:   PO intake, Supplement acceptance, Diet advancement, Labs, Weight trends, I & O's  REASON FOR ASSESSMENT:   Consult Assessment of nutrition requirement/status, Diet education  ASSESSMENT:   17 y.o. male with medical history significant for psoriasis, ADHD, and BMI 50 who presents with abdominal pain, nausea, and vomiting. Admitted for ideopathic pancreatitis.  Patient in room, no family at bedside. Pt reports he has tolerated a [place] ice tea but this took him a full day to drink. Tried beef broth yesterday morning and instantly was nauseous. Last solid food consumed was 12/3, he ate a burger from [employer]. His symptoms started around 11/30. He was sick for a week prior to that so he has not been eating normally for a few weeks.  Reviewed diet recommendations for pancreatitis. Will place handout in AVS.  Pt agreeable to supplements, will order Boost Breeze while on clears and can order Ensure Max once diet advanced.  Pt reports UBW of 388 lbs. States he hasn't been weighed this admission. Will order.  Medications reviewed.  Labs reviewed: Low Na  TG 76  NUTRITION - FOCUSED PHYSICAL EXAM:  No depletions noted.  Diet Order:   Diet Order             Diet clear liquid Room service appropriate? Yes; Fluid consistency: Thin  Diet effective now                    EDUCATION NEEDS:   Not appropriate for education at this time  Skin:  Skin Assessment: Reviewed RN Assessment  Last BM:  12/4  Height:   Ht Readings from Last 1 Encounters:  02/14/23 6\' 2"  (1.88 m) (94%, Z= 1.58)*   * Growth percentiles are based on CDC (Boys, 2-20 Years) data.    Weight:   Wt Readings from Last 1 Encounters:  02/14/23 (!) 176 kg (>99%, Z= 3.82)*   * Growth percentiles are based on CDC (Boys, 2-20 Years) data.    BMI:  Body mass index is 49.82 kg/m.  Estimated Nutritional Needs:   Kcal:  2800-3000  Protein:  130-140g  Fluid:  2.5L/day  Tilda Franco, MS, RD, LDN Inpatient Clinical Dietitian Contact via Secure chat

## 2023-02-16 NOTE — Progress Notes (Signed)
  Progress Note   Patient: Lee Haynes ZOX:[id number] DOB: 03/14/2005 DOA: 02/14/2023     2 DOS: the patient was seen and examined on 02/16/2023   Brief hospital course: 17 year old man PMH including psoriasis and ADHD who presents with abdominal pain, nausea and vomiting.  Admitted for acute pancreatitis, idiopathic.  Condition is slow to improve.  Continues on clear liquids.  Consultants None   Procedures/Events 12/7 admit for acute pancreatitis  Assessment and Plan: Acute idiopathic pancreatitis Triglycerides normal. No outpatient meds, no recent herbal substances.  Normal RUQ u/s 12/8. CT 12/7 mild diffuse soft tissue stranding around the pancreatic tail.  Findings compatible with acute pancreatitis.  No complicating features noted. No lipase today Still receiving Dilaudid with 3 doses in the last 24 hours and continued to have pain.  He is afebrile and vital signs are stable. Downgrade to clear liquids BMP and lipase tomorrow  Elevated blood pressure  No history of hypertension.  Probably related to pain.  Monitor.   Morbid obesity type III Body mass index is 46.68 kg/m. Consult dietician       Subjective:  Still having abdominal pain Some nausea, no vomiting  Physical Exam: Vitals:   02/15/23 2006 02/16/23 0539 02/16/23 1323 02/16/23 1358  BP: (!) 166/90 (!) 154/78 139/77   Pulse: 93 90 73   Resp: 16 16 17    Temp: 98.2 F (36.8 C) 99 F (37.2 C) 99.3 F (37.4 C)   TempSrc: Oral Oral Oral   SpO2: 95% 98% 94%   Weight:    (!) 164.9 kg  Height:       Physical Exam Vitals reviewed.  Constitutional:      General: He is not in acute distress.    Appearance: He is not ill-appearing or toxic-appearing.  Cardiovascular:     Rate and Rhythm: Normal rate and regular rhythm.     Heart sounds: No murmur heard. Pulmonary:     Effort: Pulmonary effort is normal. No respiratory distress.     Breath sounds: No wheezing, rhonchi or rales.  Abdominal:     General:  There is no distension.     Palpations: Abdomen is soft.     Tenderness: There is abdominal tenderness (Mild epigastric tenderness).  Neurological:     Mental Status: He is alert.  Psychiatric:        Mood and Affect: Mood normal.        Behavior: Behavior normal.     Data Reviewed: CMP noted CBC WNL No lipase today Normal RUQ u/s 12/8 CT 12/7 mild diffuse soft tissue stranding around the pancreatic tail.  Findings compatible with acute pancreatitis.  No complicating features noted.  Family Communication: none   Disposition: Status is: Inpatient Remains inpatient appropriate because: acute pancreatitis     Time spent: 23 17 year old man PMH including psoriasis and ADHD minutes  Author: Brendia Sacks, MD 02/16/2023 2:45 PM  For on call review [email or web address].

## 2023-02-16 NOTE — Plan of Care (Signed)
  Problem: Education: Goal: Knowledge of General Education information will improve Description: Including pain rating scale, medication(s)/side effects and non-pharmacologic comfort measures Outcome: Progressing   Problem: Nutrition: Goal: Adequate nutrition will be maintained Outcome: Progressing   

## 2023-02-16 NOTE — Plan of Care (Signed)
  Problem: Education: Goal: Knowledge of General Education information will improve Description: Including pain rating scale, medication(s)/side effects and non-pharmacologic comfort measures Outcome: Progressing   Problem: Pain Management: Goal: General experience of comfort will improve Outcome: Progressing

## 2023-02-16 NOTE — Hospital Course (Addendum)
17 year old man PMH including psoriasis and ADHD who presents with abdominal pain, nausea and vomiting.  Admitted for acute pancreatitis, idiopathic.  Condition is slow to improve.  Downgraded to n.p.o. with ice chips today.  Consultants None   Procedures/Events 12/7 admit for acute pancreatitis

## 2023-02-16 NOTE — Progress Notes (Signed)
   02/16/23 1335  TOC Brief Assessment  Insurance and Status Reviewed  Patient has primary care physician Yes  Home environment has been reviewed Private  Prior level of function: Independent  Prior/Current Home Services No current home services  Social Determinants of Health Reivew SDOH reviewed no interventions necessary  Readmission risk has been reviewed Yes

## 2023-02-17 DIAGNOSIS — K859 Acute pancreatitis without necrosis or infection, unspecified: Secondary | ICD-10-CM | POA: Diagnosis not present

## 2023-02-17 LAB — COMPREHENSIVE METABOLIC PANEL
ALT: 22 U/L (ref 0–44)
AST: 14 U/L — ABNORMAL LOW (ref 15–41)
Albumin: 3.7 g/dL (ref 3.5–5.0)
Alkaline Phosphatase: 67 U/L (ref 38–126)
Anion gap: 10 (ref 5–15)
BUN: 8 mg/dL (ref 6–20)
CO2: 23 mmol/L (ref 22–32)
Calcium: 9 mg/dL (ref 8.9–10.3)
Chloride: 102 mmol/L (ref 98–111)
Creatinine, Ser: 0.92 mg/dL (ref 0.61–1.24)
GFR, Estimated: >60 mL/min (ref >60)
Glucose, Bld: 83 mg/dL (ref 70–99)
Potassium: 3.6 mmol/L (ref 3.5–5.1)
Sodium: 135 mmol/L (ref 135–145)
Total Bilirubin: 1 mg/dL (ref <1.2)
Total Protein: 7.8 g/dL (ref 6.5–8.1)

## 2023-02-17 LAB — CBC
HCT: 38.8 % — ABNORMAL LOW (ref 39.0–52.0)
Hemoglobin: 13.3 g/dL (ref 13.0–17.0)
MCH: 28.3 pg (ref 26.0–34.0)
MCHC: 34.3 g/dL (ref 30.0–36.0)
MCV: 82.6 fL (ref 80.0–100.0)
Platelets: 280 10*3/uL (ref 150–400)
RBC: 4.7 MIL/uL (ref 4.22–5.81)
RDW: 12.4 % (ref 11.5–15.5)
WBC: 9.3 10*3/uL (ref 4.0–10.5)
nRBC: 0 % (ref 0.0–0.2)

## 2023-02-17 LAB — LIPASE, BLOOD: Lipase: 52 U/L — ABNORMAL HIGH (ref 11–51)

## 2023-02-17 MED ORDER — KCL IN DEXTROSE-NACL 20-5-0.45 MEQ/L-%-% IV SOLN
INTRAVENOUS | Status: DC
  Filled 2023-02-17 (×4): qty 1000

## 2023-02-17 NOTE — Progress Notes (Signed)
  Progress Note   Patient: Lee Haynes ZOX:[id number] DOB: November 04, 2005 DOA: 02/14/2023     3 DOS: the patient was seen and examined on 02/17/2023   Brief hospital course: 17 year old man PMH including psoriasis and ADHD who presents with abdominal pain, nausea and vomiting.  Admitted for acute pancreatitis, idiopathic.  Condition is slow to improve.  Downgraded to n.p.o. with ice chips today.  Consultants None   Procedures/Events 12/7 admit for acute pancreatitis  Assessment and Plan: Acute idiopathic pancreatitis Triglycerides normal. No outpatient meds, no recent herbal substances.  Normal RUQ u/s 12/8. CT 12/7 mild diffuse soft tissue stranding around the pancreatic tail.  Findings compatible with acute pancreatitis.  No complicating features noted. Lipase minimally elevated and trending down. Frequent hydromorphone. Has pain w/ clear liquids. Exam bening, no fever, normal Ca2+ and WBC Downgrade to NPO w/ sips, ice chips If fails to improve may need tube feeds   Elevated blood pressure  No history of hypertension.  Probably related to pain.  Monitor.   Morbid obesity type III Body mass index is 46.68 kg/m. Boost Breeze po TID, each supplement provides 250 kcal and 9 grams of protein  Needs updated weight, current weight recorded is stated B complex w/ Vitamin C daily  Once diet advanced:  -Ensure MAX Protein po TID, each supplement provides 150 kcal and 30 grams of protein        Subjective:  Feels about the same or even worse.  Every time he drinks liquids he has some pain.  Has required IV pain medication.  Physical Exam: Vitals:   02/16/23 1358 02/16/23 2053 02/17/23 0617 02/17/23 1349  BP:  (!) 137/93 127/87 (!) 154/94  Pulse:  70 69 95  Resp:  18 18 17   Temp:  98.9 F (37.2 C) 98.5 F (36.9 C) 98.6 F (37 C)  TempSrc:  Oral Oral Oral  SpO2:  99% 98% 98%  Weight: (!) 164.9 kg     Height:       Physical Exam Vitals reviewed.  Constitutional:       General: He is not in acute distress.    Appearance: He is not ill-appearing or toxic-appearing.  Cardiovascular:     Rate and Rhythm: Normal rate and regular rhythm.     Heart sounds: No murmur heard. Pulmonary:     Effort: Pulmonary effort is normal. No respiratory distress.     Breath sounds: No wheezing, rhonchi or rales.  Abdominal:     Palpations: Abdomen is soft.  Neurological:     Mental Status: He is alert.  Psychiatric:        Mood and Affect: Mood normal.        Behavior: Behavior normal.     Data Reviewed: Complete metabolic panel unremarkable.  Lipase down to 52.  CBC is unremarkable.  Family Communication:   Disposition: Status is: Inpatient Remains inpatient appropriate because: acute pancreatitis     Time spent: 20 not improving thus far.  Had to downgrade minutes  Author: Brendia Sacks, MD 02/17/2023 4:46 PM  For on call review [email or web address].

## 2023-02-17 NOTE — Plan of Care (Signed)
  Problem: Education: Goal: Knowledge of General Education information will improve Description: Including pain rating scale, medication(s)/side effects and non-pharmacologic comfort measures Outcome: Progressing   Problem: Pain Management: Goal: General experience of comfort will improve Outcome: Progressing

## 2023-02-17 NOTE — Plan of Care (Signed)
  Problem: Education: Goal: Knowledge of General Education information will improve Description: Including pain rating scale, medication(s)/side effects and non-pharmacologic comfort measures Outcome: Progressing   Problem: Activity: Goal: Risk for activity intolerance will decrease Outcome: Progressing   

## 2023-02-18 DIAGNOSIS — K859 Acute pancreatitis without necrosis or infection, unspecified: Secondary | ICD-10-CM | POA: Diagnosis not present

## 2023-02-18 LAB — BASIC METABOLIC PANEL
Anion gap: 8 (ref 5–15)
BUN: 7 mg/dL (ref 6–20)
CO2: 24 mmol/L (ref 22–32)
Calcium: 8.8 mg/dL — ABNORMAL LOW (ref 8.9–10.3)
Chloride: 100 mmol/L (ref 98–111)
Creatinine, Ser: 1.04 mg/dL (ref 0.61–1.24)
GFR, Estimated: >60 mL/min (ref >60)
Glucose, Bld: 158 mg/dL — ABNORMAL HIGH (ref 70–99)
Potassium: 3.9 mmol/L (ref 3.5–5.1)
Sodium: 132 mmol/L — ABNORMAL LOW (ref 135–145)

## 2023-02-18 LAB — LIPASE, BLOOD: Lipase: 51 U/L (ref 11–51)

## 2023-02-18 MED ORDER — KCL IN DEXTROSE-NACL 20-5-0.45 MEQ/L-%-% IV SOLN
INTRAVENOUS | Status: DC
  Filled 2023-02-18 (×2): qty 1000

## 2023-02-18 NOTE — Plan of Care (Signed)
  Problem: Education: Goal: Knowledge of General Education information will improve Description Including pain rating scale, medication(s)/side effects and non-pharmacologic comfort measures Outcome: Progressing   Problem: Health Behavior/Discharge Planning: Goal: Ability to manage health-related needs will improve Outcome: Progressing   

## 2023-02-18 NOTE — Progress Notes (Signed)
[facility] Progress Note Patient: Lee Haynes UEA:[id number] DOB: 2005/08/18 DOA: 02/14/2023  DOS: the patient was seen and examined on 02/18/2023  17 year old man PMH including psoriasis and ADHD who presents with abdominal pain, nausea and vomiting.  Admitted for acute pancreatitis, idiopathic.  Condition is slow to improve.  Downgraded to n.p.o. with ice chips today.  Consultants None   Procedures/Events 12/7 admit for acute pancreatitis   Assessment and Plan: Acute idiopathic pancreatitis Triglycerides normal. No outpatient meds, no recent herbal substances.  Normal RUQ u/s 12/8. CT 12/7 mild diffuse soft tissue stranding around the pancreatic tail.  Findings compatible with acute pancreatitis.  No complicating features noted. Lipase minimally elevated and trending down. Will advance diet and monitor.  If fails to improve may need GI consultation. Send IgG4   Elevated blood pressure  No history of hypertension.  Probably related to pain.  Monitor.   Morbid obesity type III Body mass index is 46.68 kg/m.  Boost Breeze po TID, each supplement provides 250 kcal and 9 grams of protein  Needs updated weight, current weight recorded is stated B complex w/ Vitamin C daily  Subjective: Abdominal pain still present but improving.  No nausea no vomiting.  Had a BM.  Physical Exam: General: in Mild distress, No Rash Cardiovascular: S1 and S2 Present, No Murmur Respiratory: Good respiratory effort, Bilateral Air entry present. No Crackles, No wheezes Abdomen: Bowel Sound present, No tenderness on gentle palpation Extremities: No edema Neuro: Alert and oriented x3, no new focal deficit  Data Reviewed: I have Reviewed nursing notes, Vitals, and Lab results. Since last encounter, pertinent lab results CBC and BMP   . I have ordered test including CBC and BMP and IgG4  .   Disposition: Status is: Inpatient Remains inpatient appropriate because: Monitor for improvement in  abdominal pain and diet tolerance   Family Communication: No one at bedside Level of care: Med-Surg   Vitals:   02/17/23 1349 02/17/23 2156 02/18/23 0612 02/18/23 1348  BP: (!) 154/94 131/69 131/80 (!) 140/74  Pulse: 95 65 64 81  Resp: 17 18 18    Temp: 98.6 F (37 C) 98.8 F (37.1 C) 98.4 F (36.9 C) 98.6 F (37 C)  TempSrc: Oral Oral Oral Oral  SpO2: 98% 100% 99% 98%  Weight:      Height:         Author: Lynden Oxford, MD 02/18/2023 7:29 PM  Please look on [email or web address] to find out who is on call.

## 2023-02-19 DIAGNOSIS — K859 Acute pancreatitis without necrosis or infection, unspecified: Secondary | ICD-10-CM | POA: Diagnosis not present

## 2023-02-19 LAB — CBC WITH DIFFERENTIAL/PLATELET
Abs Immature Granulocytes: 0.02 10*3/uL (ref 0.00–0.07)
Basophils Absolute: 0.1 10*3/uL (ref 0.0–0.1)
Basophils Relative: 1 %
Eosinophils Absolute: 0.3 10*3/uL (ref 0.0–0.5)
Eosinophils Relative: 4 %
HCT: 40.5 % (ref 39.0–52.0)
Hemoglobin: 13.4 g/dL (ref 13.0–17.0)
Immature Granulocytes: 0 %
Lymphocytes Relative: 35 %
Lymphs Abs: 2.6 10*3/uL (ref 0.7–4.0)
MCH: 27.5 pg (ref 26.0–34.0)
MCHC: 33.1 g/dL (ref 30.0–36.0)
MCV: 83.2 fL (ref 80.0–100.0)
Monocytes Absolute: 0.5 10*3/uL (ref 0.1–1.0)
Monocytes Relative: 7 %
Neutro Abs: 3.8 10*3/uL (ref 1.7–7.7)
Neutrophils Relative %: 53 %
Platelets: 309 10*3/uL (ref 150–400)
RBC: 4.87 MIL/uL (ref 4.22–5.81)
RDW: 12.4 % (ref 11.5–15.5)
WBC: 7.2 10*3/uL (ref 4.0–10.5)
nRBC: 0 % (ref 0.0–0.2)

## 2023-02-19 LAB — COMPREHENSIVE METABOLIC PANEL
ALT: 23 U/L (ref 0–44)
AST: 15 U/L (ref 15–41)
Albumin: 3.5 g/dL (ref 3.5–5.0)
Alkaline Phosphatase: 66 U/L (ref 38–126)
Anion gap: 7 (ref 5–15)
BUN: 6 mg/dL (ref 6–20)
CO2: 26 mmol/L (ref 22–32)
Calcium: 9 mg/dL (ref 8.9–10.3)
Chloride: 102 mmol/L (ref 98–111)
Creatinine, Ser: 1.01 mg/dL (ref 0.61–1.24)
GFR, Estimated: >60 mL/min (ref >60)
Glucose, Bld: 105 mg/dL — ABNORMAL HIGH (ref 70–99)
Potassium: 3.9 mmol/L (ref 3.5–5.1)
Sodium: 135 mmol/L (ref 135–145)
Total Bilirubin: 0.7 mg/dL (ref <1.2)
Total Protein: 7.3 g/dL (ref 6.5–8.1)

## 2023-02-19 LAB — MAGNESIUM: Magnesium: 1.9 mg/dL (ref 1.7–2.4)

## 2023-02-19 MED ORDER — OXYCODONE HCL 5 MG PO TABS: 5.0000 mg | ORAL_TABLET | ORAL | Status: DC | PRN

## 2023-02-19 MED ORDER — DOCUSATE SODIUM 100 MG PO CAPS
100.0000 mg | ORAL_CAPSULE | Freq: Two times a day (BID) | ORAL | 0 refills | Status: DC
Start: 2023-02-19 — End: 2023-03-18

## 2023-02-19 MED ORDER — POLYETHYLENE GLYCOL 3350 17 G PO PACK
17.0000 g | PACK | Freq: Every day | ORAL | Status: DC
Start: 2023-02-19 — End: 2023-02-19
  Administered 2023-02-19: 17 g via ORAL
  Filled 2023-02-19: qty 1

## 2023-02-19 MED ORDER — ACETAMINOPHEN 325 MG PO TABS: 650.0000 mg | ORAL_TABLET | Freq: Four times a day (QID) | ORAL | Status: DC | PRN

## 2023-02-19 MED ORDER — SENNOSIDES-DOCUSATE SODIUM 8.6-50 MG PO TABS
1.0000 | ORAL_TABLET | Freq: Two times a day (BID) | ORAL | Status: DC
  Administered 2023-02-19: 1 via ORAL
  Filled 2023-02-19: qty 1

## 2023-02-19 MED ORDER — OXYCODONE HCL 5 MG PO TABS: 10.0000 mg | ORAL_TABLET | ORAL | Status: DC | PRN

## 2023-02-19 MED ORDER — OXYCODONE-ACETAMINOPHEN 5-325 MG PO TABS: 1.0000 | ORAL_TABLET | Freq: Four times a day (QID) | ORAL | 0 refills | Status: DC | PRN

## 2023-02-19 MED ORDER — POLYETHYLENE GLYCOL 3350 17 G PO PACK: 17.0000 g | PACK | Freq: Every day | ORAL | 0 refills | Status: DC

## 2023-02-19 MED ORDER — ENSURE ENLIVE PO LIQD
237.0000 mL | Freq: Three times a day (TID) | ORAL | Status: DC
  Administered 2023-02-19: 237 mL via ORAL

## 2023-02-19 NOTE — Plan of Care (Signed)
  Problem: Clinical Measurements: Goal: Diagnostic test results will improve Outcome: Progressing   Problem: Clinical Measurements: Goal: Respiratory complications will improve Outcome: Adequate for Discharge   Problem: Clinical Measurements: Goal: Cardiovascular complication will be avoided Outcome: Adequate for Discharge   Problem: Activity: Goal: Risk for activity intolerance will decrease Outcome: Adequate for Discharge   Problem: Activity: Goal: Risk for activity intolerance will decrease Outcome: Adequate for Discharge   Problem: Nutrition: Goal: Adequate nutrition will be maintained Outcome: Progressing

## 2023-02-19 NOTE — Plan of Care (Signed)
  Problem: Education: Goal: Knowledge of General Education information will improve Description: Including pain rating scale, medication(s)/side effects and non-pharmacologic comfort measures Outcome: Adequate for Discharge   Problem: Health Behavior/Discharge Planning: Goal: Ability to manage health-related needs will improve 02/19/2023 1554 by Kathleene Hazel, RN Outcome: Adequate for Discharge 02/19/2023 1554 by Kathleene Hazel, RN Outcome: Adequate for Discharge   Problem: Clinical Measurements: Goal: Ability to maintain clinical measurements within normal limits will improve 02/19/2023 1554 by Kathleene Hazel, RN Outcome: Adequate for Discharge 02/19/2023 1554 by Kathleene Hazel, RN Outcome: Adequate for Discharge Goal: Will remain free from infection 02/19/2023 1554 by Kathleene Hazel, RN Outcome: Adequate for Discharge 02/19/2023 1554 by Kathleene Hazel, RN Outcome: Adequate for Discharge Goal: Diagnostic test results will improve 02/19/2023 1554 by Kathleene Hazel, RN Outcome: Adequate for Discharge 02/19/2023 1554 by Kathleene Hazel, RN Outcome: Adequate for Discharge Goal: Respiratory complications will improve 02/19/2023 1554 by Kathleene Hazel, RN Outcome: Adequate for Discharge 02/19/2023 1554 by Kathleene Hazel, RN Outcome: Adequate for Discharge Goal: Cardiovascular complication will be avoided 02/19/2023 1554 by Kathleene Hazel, RN Outcome: Adequate for Discharge 02/19/2023 1554 by Kathleene Hazel, RN Outcome: Adequate for Discharge   Problem: Activity: Goal: Risk for activity intolerance will decrease 02/19/2023 1554 by Kathleene Hazel, RN Outcome: Adequate for Discharge 02/19/2023 1554 by Kathleene Hazel, RN Outcome: Adequate for Discharge   Problem: Nutrition: Goal: Adequate nutrition will be maintained 02/19/2023 1554 by Kathleene Hazel, RN Outcome: Adequate for Discharge 02/19/2023 1554 by Kathleene Hazel, RN Outcome: Adequate  for Discharge   Problem: Coping: Goal: Level of anxiety will decrease 02/19/2023 1554 by Kathleene Hazel, RN Outcome: Adequate for Discharge 02/19/2023 1554 by Kathleene Hazel, RN Outcome: Adequate for Discharge

## 2023-02-20 ENCOUNTER — Encounter: Payer: Self-pay | Admitting: Gastroenterology

## 2023-02-20 ENCOUNTER — Ambulatory Visit: Payer: BC Managed Care – PPO | Admitting: Gastroenterology

## 2023-02-20 VITALS — BP 120/76 | HR 100 | Ht 74.0 in | Wt 366.0 lb

## 2023-02-20 DIAGNOSIS — R197 Diarrhea, unspecified: Secondary | ICD-10-CM | POA: Diagnosis not present

## 2023-02-20 DIAGNOSIS — Z8719 Personal history of other diseases of the digestive system: Secondary | ICD-10-CM

## 2023-02-20 LAB — IGG 4: IgG, Subclass 4: 18 mg/dL (ref 3–104)

## 2023-02-20 NOTE — Progress Notes (Signed)
Chief Complaint: Hospital follow-up of acute pancreatitis Primary GI MD: Gentry Fitz  HPI: 17 year old male with past medical history including ADHD and psoriasis presenting for hospital follow-up of acute pancreatitis.  Patient recently admitted 02/14/2023 to 02/19/23 for acute pancreatitis.  Patient initially presented with 1 week of epigastric pain with associated nausea and vomiting.  Upon arrival to the ED workup was notable for leukocytosis with WBC 13.7, lipase 63.  CT abdomen pelvis with contrast showed edematous pancreas with mild diffuse soft tissue stranding around the pancreatic tail.  No signs of main ductal dilation, necrosis, pseudocyst, or mass.  Findings compatible with acute pancreatitis.  No other abnormalities.  No gallstones.  Triglycerides 76 IgG4 pending  Patient had no outpatient meds and no recent herbal substances.  Normal RUQ ultrasound.  Patient was discharged after tolerating diet without difficulty.  Patient presents today with his mother and states his symptoms are still mildly present though exponentially improved from when he first presented to the hospital.  He reports a little bit of bloating.  He does report watery stools that have been ongoing since yesterday when he was discharged.  Denies melena/hematochezia.  Denies family history of pancreatitis.  Denies alcohol use.  Denies new medications.  Denies herbal supplements.  States over the last few months he has been taking whey protein, creatine, and preworkout.  Past Medical History:  Diagnosis Date   ADHD (attention deficit hyperactivity disorder)    Anger    Central auditory processing disorder (CAPD)    Obesity    Seizures (HCC)    Had a single seizure 04/2009    History reviewed. No pertinent surgical history.  Current Outpatient Medications  Medication Sig Dispense Refill   acetaminophen (TYLENOL) 325 MG tablet Take 2 tablets (650 mg total) by mouth every 6 (six) hours as needed for  mild pain (pain score 1-3), moderate pain (pain score 4-6), fever or headache (or Fever >/= 101). (Patient not taking: Reported on 02/20/2023)     docusate sodium (COLACE) 100 MG capsule Take 1 capsule (100 mg total) by mouth 2 (two) times daily. (Patient not taking: Reported on 02/20/2023) 10 capsule 0   oxyCODONE-acetaminophen (PERCOCET) 5-325 MG tablet Take 1 tablet by mouth every 6 (six) hours as needed for severe pain (pain score 7-10). (Patient not taking: Reported on 02/20/2023) 20 tablet 0   polyethylene glycol (MIRALAX / GLYCOLAX) 17 g packet Take 17 g by mouth daily. (Patient not taking: Reported on 02/20/2023) 14 each 0   No current facility-administered medications for this visit.    Allergies as of 02/20/2023   (No Known Allergies)    Family History  Problem Relation Age of Onset   Healthy Mother    Diabetes Father        T2DM, treated with insulin.  On dialysis, awaiting kidney transplant   Learning disabilities Brother        Half brother   Developmental delay Brother        Half brother    Stroke Maternal Grandmother        Died at 22   Stroke Maternal Grandfather    Cancer Paternal Grandmother        Died at 65   Diabetes Paternal Grandfather    Depression Maternal Aunt    Paranoid behavior Maternal Aunt        Paranoid Schizophrenia   Diabetes Paternal Aunt    Diabetes Paternal Uncle    Autism Cousin  First cousin    Liver disease Neg Hx    Colon cancer Neg Hx    Esophageal cancer Neg Hx     Social History   Socioeconomic History   Marital status: Single    Spouse name: Not on file   Number of children: 0   Years of education: Not on file   Highest education level: Not on file  Occupational History   Not on file  Tobacco Use   Smoking status: Never    Passive exposure: Yes   Smokeless tobacco: Never  Vaping Use   Vaping status: Never Used  Substance and Sexual Activity   Alcohol use: Not on file   Drug use: Not on file   Sexual  activity: Not on file  Other Topics Concern   Not on file  Social History Narrative   Living dad (spends time with both parents)   11 grade at [employer]   Social Drivers of Health   Financial Resource Strain: Not on file  Food Insecurity: No Food Insecurity (02/14/2023)   Hunger Vital Sign    Worried About Running Out of Food in the Last Year: Never true    Ran Out of Food in the Last Year: Never true  Transportation Needs: No Transportation Needs (02/14/2023)   PRAPARE - Administrator, Civil Service (Medical): No    Lack of Transportation (Non-Medical): No  Physical Activity: Not on file  Stress: Not on file  Social Connections: Not on file  Intimate Partner Violence: Not At Risk (02/14/2023)   Humiliation, Afraid, Rape, and Kick questionnaire    Fear of Current or Ex-Partner: No    Emotionally Abused: No    Physically Abused: No    Sexually Abused: No    Review of Systems:    Constitutional: No weight loss, fever, chills, weakness or fatigue HEENT: Eyes: No change in vision               Ears, Nose, Throat:  No change in hearing or congestion Skin: No rash or itching Cardiovascular: No chest pain, chest pressure or palpitations   Respiratory: No SOB or cough Gastrointestinal: See HPI and otherwise negative Genitourinary: No dysuria or change in urinary frequency Neurological: No headache, dizziness or syncope Musculoskeletal: No new muscle or joint pain Hematologic: No bleeding or bruising Psychiatric: No history of depression or anxiety    Physical Exam:  Vital signs: BP 120/76   Pulse 100   Ht 6\' 2"  (1.88 m)   Wt (!) 366 lb (166 kg)   BMI 46.99 kg/m   Constitutional: NAD, Well developed, Well nourished, alert and cooperative Head:  Normocephalic and atraumatic. Eyes:   PEERL, EOMI. No icterus. Conjunctiva pink. Respiratory: Respirations even and unlabored. Lungs clear to auscultation bilaterally.   No wheezes, crackles, or rhonchi.   Cardiovascular:  Regular rate and rhythm. No peripheral edema, cyanosis or pallor.  Gastrointestinal:  Soft, nondistended, nontender. No rebound or guarding. Normal bowel sounds. No appreciable masses or hepatomegaly. Rectal:  Not performed.  Msk:  Symmetrical without gross deformities. Without edema, no deformity or joint abnormality.  Neurologic:  Alert and  oriented x4;  grossly normal neurologically.  Skin:   Dry and intact without significant lesions or rashes. Psychiatric: Oriented to person, place and time. Demonstrates good judgement and reason without abnormal affect or behaviors.   RELEVANT LABS AND IMAGING: CBC    Component Value Date/Time   WBC 7.2 02/19/2023 0450   RBC 4.87 02/19/2023  0450   HGB 13.4 02/19/2023 0450   HCT 40.5 02/19/2023 0450   PLT 309 02/19/2023 0450   MCV 83.2 02/19/2023 0450   MCH 27.5 02/19/2023 0450   MCHC 33.1 02/19/2023 0450   RDW 12.4 02/19/2023 0450   LYMPHSABS 2.6 02/19/2023 0450   MONOABS 0.5 02/19/2023 0450   EOSABS 0.3 02/19/2023 0450   BASOSABS 0.1 02/19/2023 0450    CMP     Component Value Date/Time   NA 135 02/19/2023 0450   K 3.9 02/19/2023 0450   CL 102 02/19/2023 0450   CO2 26 02/19/2023 0450   GLUCOSE 105 (H) 02/19/2023 0450   BUN 6 02/19/2023 0450   CREATININE 1.01 02/19/2023 0450   CREATININE 1.00 05/13/2021 0934   CALCIUM 9.0 02/19/2023 0450   PROT 7.3 02/19/2023 0450   ALBUMIN 3.5 02/19/2023 0450   AST 15 02/19/2023 0450   ALT 23 02/19/2023 0450   ALKPHOS 66 02/19/2023 0450   BILITOT 0.7 02/19/2023 0450   GFRNONAA >60 02/19/2023 0450   GFRAA NOT CALCULATED 04/11/2014 1757     Assessment/Plan:   History of acute pancreatitis History of acute pancreatitis of unknown etiology CT scan showed mild uncomplicated pancreatitis, no evidence of high triglycerides, no gallstones, no alcohol use, lipase of 63.  No new medications or herbal supplements though he is on preworkout, protein, and creatine.  IgG4 is currently  pending. - Extensive discussion about acute pancreatitis with patient and mother and provided patient education handouts. -- await IgG4 - No labs today (last drawn yesterday at discharge) - Recommend MRCP in 4 to 6 weeks to evaluate for improvement in pancreatitis and to evaluate for ductal abnormalities  Diarrhea Watery stools since discharge yesterday.  Recently in the hospital.  No antibiotic use. - C. difficile Diatherix to rule out - If C. difficile is negative we will recommend Creon to take with meals and snacks  Patient was assigned to Dr. Chales Abrahams today  Boone Master, PA-C [facility] 02/20/2023, 2:40 PM  Cc: Carmin Richmond, MD

## 2023-02-20 NOTE — Patient Instructions (Signed)
You have been scheduled for an MRI at [facility], 1st floor, Radiology. Your appointment is scheduled on 03/23/23 at 9:00 am. Please arrive 15-20 minutes prior to your appointment time for registration purposes. Please make certain not to have anything to eat or drink 4 hours prior to your test. In addition, if you have any metal in your body, have a pacemaker or defibrillator, please be sure to let your ordering physician know. This test typically takes 45 minutes to 1 hour to complete. Should you need to reschedule, please call [phone].   Your provider has ordered "Diatherix" stool testing for you. You have received a kit from our office today containing all necessary supplies to complete this test. Please carefully read the stool collection instructions provided in the kit before opening the accompanying materials. In addition, be sure to place the label from the top right corner of the laboratory request sheet onto the "puritan opti-swab" tube that is supplied in the kit. This label should include your full name and date of birth. After completing the test, you should secure the purtian tube into the specimen biohazard bag. The laboratory request information sheet (including date and time of specimen collection) should be placed into the outside pocket of the specimen biohazard bag and returned to the [facility] lab with 2 days of collection.   If the laboratory information sheet specimen date and time are not filled out, the test will NOT be performed.  Due to recent changes in healthcare laws, you may see the results of your imaging and laboratory studies on MyChart before your provider has had a chance to review them.  We understand that in some cases there may be results that are confusing or concerning to you. Not all laboratory results come back in the same time frame and the provider may be waiting for multiple results in order to interpret others.  Please give [place] 48 hours in order for  your provider to thoroughly review all the results before contacting the office for clarification of your results.   Thank you for trusting me with your gastrointestinal care!   Boone Master, PA

## 2023-02-22 NOTE — Progress Notes (Signed)
Agree with assessment/plan.  Raj Florestine Carmical, MD [facility] [phone]  

## 2023-02-22 NOTE — Progress Notes (Unsigned)
New Patient Office Visit  Subjective    Patient ID: Lee Haynes, male    DOB: 05-Feb-2006  Age: 17 y.o. MRN: [id number]  CC: No chief complaint on file.   HPI Lee Haynes presents to establish care ***  Outpatient Encounter Medications as of 02/23/2023  Medication Sig   acetaminophen (TYLENOL) 325 MG tablet Take 2 tablets (650 mg total) by mouth every 6 (six) hours as needed for mild pain (pain score 1-3), moderate pain (pain score 4-6), fever or headache (or Fever >/= 101). (Patient not taking: Reported on 02/20/2023)   docusate sodium (COLACE) 100 MG capsule Take 1 capsule (100 mg total) by mouth 2 (two) times daily. (Patient not taking: Reported on 02/20/2023)   oxyCODONE-acetaminophen (PERCOCET) 5-325 MG tablet Take 1 tablet by mouth every 6 (six) hours as needed for severe pain (pain score 7-10). (Patient not taking: Reported on 02/20/2023)   polyethylene glycol (MIRALAX / GLYCOLAX) 17 g packet Take 17 g by mouth daily. (Patient not taking: Reported on 02/20/2023)   No facility-administered encounter medications on file as of 02/23/2023.    Past Medical History:  Diagnosis Date   ADHD (attention deficit hyperactivity disorder)    Anger    Central auditory processing disorder (CAPD)    Obesity    Seizures (HCC)    Had a single seizure 04/2009    No past surgical history on file.  Family History  Problem Relation Age of Onset   Healthy Mother    Diabetes Father        T2DM, treated with insulin.  On dialysis, awaiting kidney transplant   Learning disabilities Brother        Half brother   Developmental delay Brother        Half brother    Stroke Maternal Grandmother        Died at 61   Stroke Maternal Grandfather    Cancer Paternal Grandmother        Died at 69   Diabetes Paternal Grandfather    Depression Maternal Aunt    Paranoid behavior Maternal Aunt        Paranoid Schizophrenia   Diabetes Paternal Aunt    Diabetes Paternal Uncle    Autism Cousin         First cousin    Liver disease Neg Hx    Colon cancer Neg Hx    Esophageal cancer Neg Hx     Social History   Socioeconomic History   Marital status: Single    Spouse name: Not on file   Number of children: 0   Years of education: Not on file   Highest education level: Not on file  Occupational History   Not on file  Tobacco Use   Smoking status: Never    Passive exposure: Yes   Smokeless tobacco: Never  Vaping Use   Vaping status: Never Used  Substance and Sexual Activity   Alcohol use: Not on file   Drug use: Not on file   Sexual activity: Not on file  Other Topics Concern   Not on file  Social History Narrative   Living dad (spends time with both parents)   11 grade at [employer]   Social Drivers of Health   Financial Resource Strain: Not on file  Food Insecurity: No Food Insecurity (02/14/2023)   Hunger Vital Sign    Worried About Running Out of Food in the Last Year: Never true    Ran Out of  Food in the Last Year: Never true  Transportation Needs: No Transportation Needs (02/14/2023)   PRAPARE - Administrator, Civil Service (Medical): No    Lack of Transportation (Non-Medical): No  Physical Activity: Not on file  Stress: Not on file  Social Connections: Not on file  Intimate Partner Violence: Not At Risk (02/14/2023)   Humiliation, Afraid, Rape, and Kick questionnaire    Fear of Current or Ex-Partner: No    Emotionally Abused: No    Physically Abused: No    Sexually Abused: No    ROS Per HPI      Objective    There were no vitals taken for this visit.  Physical Exam      Assessment & Plan:   There are no diagnoses linked to this encounter.   No follow-ups on file.   Moshe Cipro, FNP

## 2023-02-23 ENCOUNTER — Ambulatory Visit: Payer: BC Managed Care – PPO | Admitting: Family Medicine

## 2023-02-23 DIAGNOSIS — I10 Essential (primary) hypertension: Secondary | ICD-10-CM

## 2023-02-23 DIAGNOSIS — E782 Mixed hyperlipidemia: Secondary | ICD-10-CM

## 2023-02-23 NOTE — Discharge Summary (Signed)
Physician Discharge Summary   Patient: Lee Haynes MRN: [id number] DOB: 10-15-2005  Admit date:     02/14/2023  Discharge date: 02/19/2023  Discharge Physician: Lynden Oxford  PCP: Carmin Richmond, MD  Recommendations at discharge: Follow-up with PCP as recommended.   Follow-up Information     Carmin Richmond, MD. Schedule an appointment as soon as possible for a visit in 1 week(s).   Specialty: Pediatrics Contact information: [address], [address] [facility] [place] [address] [phone]                Discharge Diagnoses: Principal Problem:   Acute pancreatitis Active Problems:   Scalp psoriasis   Elevated blood pressure reading   Obesity, Class III, BMI 40-49.9 (morbid obesity) [facility])  Hospital Course: Acute idiopathic pancreatitis Triglycerides normal. No outpatient meds, no recent herbal substances.  Normal RUQ u/s 12/8. CT 12/7 mild diffuse soft tissue stranding around the pancreatic tail.  Findings compatible with acute pancreatitis.  No complicating features noted. Lipase minimally elevated and trending down. Send IgG4 Tolerating oral diet without any nausea or vomiting or abdominal pain. Plan for outpatient follow-up with GI   Elevated blood pressure  No history of hypertension.  Probably related to pain.  Monitor.   Morbid obesity type III Body mass index is 46.68 kg/m.  Placing the patient at a high risk for poor outcome. Boost Breeze po TID, each supplement provides 250 kcal and 9 grams of protein    Pain control - [place] Controlled Substance Reporting System database was reviewed. and patient was instructed, not to drive, operate heavy machinery, perform activities at heights, swimming or participation in water activities or provide baby-sitting services while on Pain, Sleep and Anxiety Medications; until their outpatient Physician has advised to do so again. Also recommended to not to take more than  prescribed Pain, Sleep and Anxiety Medications.  Consultants:  none  Procedures performed:  none  DISCHARGE MEDICATION: Allergies as of 02/19/2023   No Known Allergies      Medication List     STOP taking these medications    omeprazole 40 MG capsule Commonly known as: PRILOSEC   polyethylene glycol powder 17 GM/SCOOP powder Commonly known as: MiraLax Replaced by: polyethylene glycol 17 g packet       TAKE these medications    acetaminophen 325 MG tablet Commonly known as: TYLENOL Take 2 tablets (650 mg total) by mouth every 6 (six) hours as needed for mild pain (pain score 1-3), moderate pain (pain score 4-6), fever or headache (or Fever >/= 101).   docusate sodium 100 MG capsule Commonly known as: Colace Take 1 capsule (100 mg total) by mouth 2 (two) times daily.   oxyCODONE-acetaminophen 5-325 MG tablet Commonly known as: Percocet Take 1 tablet by mouth every 6 (six) hours as needed for severe pain (pain score 7-10).   polyethylene glycol 17 g packet Commonly known as: MIRALAX / GLYCOLAX Take 17 g by mouth daily. Replaces: polyethylene glycol powder 17 GM/SCOOP powder       Disposition: Home Diet recommendation: Cardiac diet  Discharge Exam: Vitals:   02/18/23 2128 02/19/23 0500 02/19/23 0552 02/19/23 1335  BP: (!) 151/88  (!) 107/54 (!) 123/58  Pulse: 62  (!) 54 77  Resp: 16  14 20   Temp: 99.1 F (37.3 C)  97.9 F (36.6 C) 98.2 F (36.8 C)  TempSrc: Oral  Oral Oral  SpO2: 100%  100% 99%  Weight:  (!) 165.5  kg    Height:       General: Appear in mild distress; no visible Abnormal Neck Mass Or lumps, Conjunctiva normal Cardiovascular: S1 and S2 Present, no Murmur, Respiratory: good respiratory effort, Bilateral Air entry present and CTA, no Crackles, no wheezes Abdomen: Bowel Sound present, Non tender  Extremities: no Pedal edema Neurology: alert and oriented to time, place, and person  Filed Weights   02/14/23 1320 02/16/23 1358 02/19/23  0500  Weight: (!) 176 kg (!) 164.9 kg (!) 165.5 kg   Condition at discharge: stable  The results of significant diagnostics from this hospitalization (including imaging, microbiology, ancillary and laboratory) are listed below for reference.   Imaging Studies: US Abdomen Limited RUQ (LIVER/GB) Result Date: 02/15/2023 CLINICAL DATA:  Acute pancreatitis EXAM: ULTRASOUND ABDOMEN LIMITED RIGHT UPPER QUADRANT COMPARISON:  CT yesterday FINDINGS: Gallbladder: No gallstones or wall thickening visualized. No sonographic Murphy sign noted by sonographer. Common bile duct: Diameter: Normal, 2 mm Liver: No focal lesion identified. Within normal limits in parenchymal echogenicity. Portal vein is patent on color Doppler imaging with normal direction of blood flow towards the liver. Other: None. IMPRESSION: Normal right upper quadrant ultrasound. No cholelithiasis or explanation for pancreatitis. Electronically Signed   By: Jeronimo Greaves M.D.   On: 02/15/2023 13:05   CT ABDOMEN PELVIS W CONTRAST Result Date: 02/14/2023 CLINICAL DATA:  Abdominal pain.  Nausea and vomiting for 6 days. EXAM: CT ABDOMEN AND PELVIS WITH CONTRAST TECHNIQUE: Multidetector CT imaging of the abdomen and pelvis was performed using the standard protocol following bolus administration of intravenous contrast. RADIATION DOSE REDUCTION: This exam was performed according to the departmental dose-optimization program which includes automated exposure control, adjustment of the mA and/or kV according to patient size and/or use of iterative reconstruction technique. CONTRAST:  OMNIPAQUE IOHEXOL 300 MG/ML  SOLN COMPARISON:  None Available. FINDINGS: Lower chest: No acute abnormality. Hepatobiliary: No focal liver abnormality is seen. No gallstones, gallbladder wall thickening, or biliary dilatation. Pancreas: The pancreas appears edematous. There is mild diffuse soft tissue stranding about the pancreatic tail. No signs of main duct dilatation,  necrosis, pseudocyst or mass. Spleen: Normal in size without focal abnormality. Adrenals/Urinary Tract: Adrenal glands are unremarkable. Kidneys are normal, without renal calculi, focal lesion, or hydronephrosis. Bladder is unremarkable. Stomach/Bowel: Stomach appears normal. No dilated loops of large or small bowel. No bowel wall thickening or inflammation identified. Vascular/Lymphatic: The abdominal vascularity is patent. Normal caliber of the abdominal aorta. No signs of abdominopelvic adenopathy. Reproductive: Prostate is unremarkable. Other: No free fluid or fluid collections identified. No signs of pneumoperitoneum. Musculoskeletal: No acute or significant osseous findings. IMPRESSION: The pancreas appears edematous with mild diffuse soft tissue stranding about the pancreatic tail. Findings are compatible with acute pancreatitis. No signs of main duct dilatation, necrosis, pseudocyst or mass. Electronically Signed   By: Signa Kell M.D.   On: 02/14/2023 16:06   DG Abd 2 Views Result Date: 02/12/2023 CLINICAL DATA:  Abdomen pain nausea and vomiting EXAM: ABDOMEN - 2 VIEW COMPARISON:  02/14/2014 FINDINGS: No free air beneath the diaphragm. Nonobstructed gas pattern with moderate stool burden. No radiopaque calculi. IMPRESSION: Nonobstructed gas pattern with moderate stool burden. Electronically Signed   By: Jasmine Pang M.D.   On: 02/12/2023 19:42    Microbiology: Results for orders placed or performed during the hospital encounter of 09/27/20  SARS CORONAVIRUS 2 (TAT 6-24 HRS) Nasopharyngeal Nasopharyngeal Swab     Status: None   Collection Time: 09/27/20  5:05 PM  Specimen: Nasopharyngeal Swab  Result Value Ref Range Status   SARS Coronavirus 2 NEGATIVE NEGATIVE Final    Comment: (NOTE) SARS-CoV-2 target nucleic acids are NOT DETECTED.  The SARS-CoV-2 RNA is generally detectable in upper and lower respiratory specimens during the acute phase of infection. Negative results do not  preclude SARS-CoV-2 infection, do not rule out co-infections with other pathogens, and should not be used as the sole basis for treatment or other patient management decisions. Negative results must be combined with clinical observations, patient history, and epidemiological information. The expected result is Negative.  Fact Sheet for Patients: HairSlick.no  Fact Sheet for Healthcare Providers: quierodirigir.com  This test is not yet approved or cleared by the [place] FDA and  has been authorized for detection and/or diagnosis of SARS-CoV-2 by FDA under an Emergency Use Authorization (EUA). This EUA will remain  in effect (meaning this test can be used) for the duration of the COVID-19 declaration under Se ction 564(b)(1) of the Act, 21 U.S.C. section 360bbb-3(b)(1), unless the authorization is terminated or revoked sooner.  Performed at [facility], [address] [place], [place] [address]   Culture, group A strep (throat)     Status: None   Collection Time: 09/27/20  5:10 PM   Specimen: Throat  Result Value Ref Range Status   Specimen Description THROAT  Final   Special Requests   Final    NONE Performed at [facility], [address], [place], [place] [address]    Culture MODERATE STREPTOCOCCUS,BETA HEMOLYTIC NOT GROUP A  Final   Report Status 09/30/2020 FINAL  Final   Labs: CBC: Recent Labs  Lab 02/17/23 0408 02/19/23 0450  WBC 9.3 7.2  NEUTROABS  --  3.8  HGB 13.3 13.4  HCT 38.8* 40.5  MCV 82.6 83.2  PLT 280 309   Basic Metabolic Panel: Recent Labs  Lab 02/17/23 0408 02/18/23 0434 02/19/23 0450  NA 135 132* 135  K 3.6 3.9 3.9  CL 102 100 102  CO2 23 24 26   GLUCOSE 83 158* 105*  BUN 8 7 6   CREATININE 0.92 1.04 1.01  CALCIUM 9.0 8.8* 9.0  MG  --   --  1.9   Liver Function Tests: Recent Labs  Lab 02/17/23 0408 02/19/23 0450  AST 14* 15  ALT 22 23  ALKPHOS 67 66   BILITOT 1.0 0.7  PROT 7.8 7.3  ALBUMIN 3.7 3.5   CBG: No results for input(s): "GLUCAP" in the last 168 hours.  Discharge time spent: greater than 30 minutes.  Author: Lynden Oxford, MD  [facility] 02/19/2023

## 2023-03-17 NOTE — Progress Notes (Signed)
 New Patient Office Visit  Subjective    Patient ID: Lee Haynes, male    DOB: [birth year]  Age: 18 y.o. MRN: [id number]  CC:  Chief Complaint  Patient presents with   Establish Care    Establish Care. Wanted provider to be aware of inpatient stay at the hospital (12/07) and that patient would be having an MRI on the 17th.    HPI Lee Haynes presents to establish care today. Was recently treated inpatient for acute pancreatitis. Has seen [facility] and has f/u MRI scheduled for 03/23/2023. He is unsure about past vaccines, he is accompanied by his mother and she is also unsure. Declines flu vaccine today. Has positive history of COVID, has never had COVID vaccines, declines COVID vaccines. Has strong family history of type 2 diabetes, hypertension and strokes. Denies personal history of any of the above. Denies abdominal pain, nausea, vomiting, diarrhea, rash, fever, chills, other symptoms. Medical history as outlined below.   Outpatient Encounter Medications as of 03/18/2023  Medication Sig   [DISCONTINUED] acetaminophen  (TYLENOL ) 325 MG tablet Take 2 tablets (650 mg total) by mouth every 6 (six) hours as needed for mild pain (pain score 1-3), moderate pain (pain score 4-6), fever or headache (or Fever >/= 101). (Patient not taking: Reported on 02/20/2023)   [DISCONTINUED] docusate sodium  (COLACE) 100 MG capsule Take 1 capsule (100 mg total) by mouth 2 (two) times daily. (Patient not taking: Reported on 02/20/2023)   [DISCONTINUED] oxyCODONE -acetaminophen  (PERCOCET) 5-325 MG tablet Take 1 tablet by mouth every 6 (six) hours as needed for severe pain (pain score 7-10). (Patient not taking: Reported on 02/20/2023)   [DISCONTINUED] polyethylene glycol (MIRALAX  / GLYCOLAX ) 17 g packet Take 17 g by mouth daily. (Patient not taking: Reported on 02/20/2023)   No facility-administered encounter medications on file as of 03/18/2023.    Past Medical History:  Diagnosis Date   ADHD  (attention deficit hyperactivity disorder)    Anger    Central auditory processing disorder (CAPD)    Obesity    Seizures (HCC)    Had a single seizure 04/2009    History reviewed. No pertinent surgical history.  Family History  Problem Relation Age of Onset   Healthy Mother    Diabetes Father        T2DM, treated with insulin.  On dialysis, awaiting kidney transplant   Learning disabilities Brother        Half brother   Developmental delay Brother        Half brother    Stroke Maternal Grandmother        Died at 67   Stroke Maternal Grandfather    Cancer Paternal Grandmother        Died at 55   Diabetes Paternal Grandfather    Depression Maternal Aunt    Paranoid behavior Maternal Aunt        Paranoid Schizophrenia   Diabetes Paternal Aunt    Diabetes Paternal Uncle    Autism Cousin        First cousin    Liver disease Neg Hx    Colon cancer Neg Hx    Esophageal cancer Neg Hx     Social History   Socioeconomic History   Marital status: Single    Spouse name: Not on file   Number of children: 0   Years of education: Not on file   Highest education level: Not on file  Occupational History   Not on file  Tobacco Use  Smoking status: Never    Passive exposure: Yes   Smokeless tobacco: Never  Vaping Use   Vaping status: Never Used  Substance and Sexual Activity   Alcohol use: Not on file   Drug use: Not on file   Sexual activity: Not on file  Other Topics Concern   Not on file  Social History Narrative   Living dad (spends time with both parents)   11 grade at [employer]   Social Drivers of Health   Financial Resource Strain: Not on file  Food Insecurity: No Food Insecurity (02/14/2023)   Hunger Vital Sign    Worried About Running Out of Food in the Last Year: Never true    Ran Out of Food in the Last Year: Never true  Transportation Needs: No Transportation Needs (02/14/2023)   PRAPARE - Administrator, Civil Service (Medical): No    Lack  of Transportation (Non-Medical): No  Physical Activity: Not on file  Stress: Not on file  Social Connections: Not on file  Intimate Partner Violence: Not At Risk (02/14/2023)   Humiliation, Afraid, Rape, and Kick questionnaire    Fear of Current or Ex-Partner: No    Emotionally Abused: No    Physically Abused: No    Sexually Abused: No    ROS Per HPI      Objective    BP 130/82   Pulse 80   Temp 98.5 F (36.9 C)   Ht 6' 2.01 (1.88 m)   Wt (!) 372 lb 9.6 oz (169 kg)   SpO2 98%   BMI 47.83 kg/m   Physical Exam Vitals and nursing note reviewed.  Constitutional:      General: He is not in acute distress.    Appearance: Normal appearance. He is obese.  HENT:     Head: Normocephalic and atraumatic.     Right Ear: Tympanic membrane and ear canal normal.     Left Ear: Tympanic membrane and ear canal normal.     Nose: Nose normal.     Mouth/Throat:     Mouth: Mucous membranes are moist.     Pharynx: Oropharynx is clear. No oropharyngeal exudate or posterior oropharyngeal erythema.  Eyes:     Extraocular Movements: Extraocular movements intact.     Conjunctiva/sclera: Conjunctivae normal.     Pupils: Pupils are equal, round, and reactive to light.  Cardiovascular:     Rate and Rhythm: Normal rate and regular rhythm.     Pulses: Normal pulses.     Heart sounds: Normal heart sounds. No murmur heard. Pulmonary:     Effort: Pulmonary effort is normal. No respiratory distress.     Breath sounds: Normal breath sounds. No wheezing, rhonchi or rales.  Musculoskeletal:        General: Normal range of motion.     Cervical back: Normal range of motion.  Lymphadenopathy:     Cervical: No cervical adenopathy.  Skin:    General: Skin is warm and dry.     Capillary Refill: Capillary refill takes less than 2 seconds.     Comments: Scarring to medial and lateral R wrist  Neurological:     General: No focal deficit present.     Mental Status: He is alert and oriented to person,  place, and time.  Psychiatric:        Mood and Affect: Mood normal.        Behavior: Behavior normal.         Assessment &  Plan:   Essential hypertension -     Lipid panel -     CBC with Differential/Platelet  Mixed hyperlipidemia -     Lipid panel -     CBC with Differential/Platelet  Scalp psoriasis -Continue to manage with Nizoral as needed  Elevated blood sugar -     CBC with Differential/Platelet -     Comprehensive metabolic panel -     Hemoglobin A1c  Morbid obesity with BMI of 45.0-49.9, adult (HCC) -Discussed healthy diet and activity level -Works at [employer] and is very active at work, does not really pay attention to what he is eating  Return in about 1 year (around 03/17/2024).   Corean Ku, FNP

## 2023-03-18 ENCOUNTER — Ambulatory Visit (INDEPENDENT_AMBULATORY_CARE_PROVIDER_SITE_OTHER): Payer: BC Managed Care – PPO | Admitting: Family Medicine

## 2023-03-18 ENCOUNTER — Encounter: Payer: Self-pay | Admitting: Family Medicine

## 2023-03-18 VITALS — BP 130/82 | HR 80 | Temp 98.5°F | Ht 74.01 in | Wt 372.6 lb

## 2023-03-18 DIAGNOSIS — Z6841 Body Mass Index (BMI) 40.0 and over, adult: Secondary | ICD-10-CM

## 2023-03-18 DIAGNOSIS — L409 Psoriasis, unspecified: Secondary | ICD-10-CM

## 2023-03-18 DIAGNOSIS — E782 Mixed hyperlipidemia: Secondary | ICD-10-CM | POA: Diagnosis not present

## 2023-03-18 DIAGNOSIS — R739 Hyperglycemia, unspecified: Secondary | ICD-10-CM

## 2023-03-18 DIAGNOSIS — F909 Attention-deficit hyperactivity disorder, unspecified type: Secondary | ICD-10-CM | POA: Diagnosis not present

## 2023-03-18 DIAGNOSIS — I10 Essential (primary) hypertension: Secondary | ICD-10-CM | POA: Diagnosis not present

## 2023-03-18 LAB — CBC WITH DIFFERENTIAL/PLATELET
Basophils Absolute: 0 10*3/uL (ref 0.0–0.1)
Basophils Relative: 0.4 % (ref 0.0–3.0)
Eosinophils Absolute: 0.1 10*3/uL (ref 0.0–0.7)
Eosinophils Relative: 1.1 % (ref 0.0–5.0)
HCT: 43.9 % (ref 36.0–49.0)
Hemoglobin: 14.6 g/dL (ref 12.0–16.0)
Lymphocytes Relative: 27.5 % (ref 24.0–48.0)
Lymphs Abs: 2.5 10*3/uL (ref 0.7–4.0)
MCHC: 33.1 g/dL (ref 31.0–37.0)
MCV: 84.1 fL (ref 78.0–98.0)
Monocytes Absolute: 0.6 10*3/uL (ref 0.1–1.0)
Monocytes Relative: 6.3 % (ref 3.0–12.0)
Neutro Abs: 5.8 10*3/uL (ref 1.4–7.7)
Neutrophils Relative %: 64.7 % (ref 43.0–71.0)
Platelets: 206 10*3/uL (ref 150.0–575.0)
RBC: 5.22 Mil/uL (ref 3.80–5.70)
RDW: 14.5 % (ref 11.4–15.5)
WBC: 9 10*3/uL (ref 4.5–13.5)

## 2023-03-18 LAB — COMPREHENSIVE METABOLIC PANEL
ALT: 28 U/L (ref 0–53)
AST: 26 U/L (ref 0–37)
Albumin: 4.2 g/dL (ref 3.5–5.2)
Alkaline Phosphatase: 85 U/L (ref 52–171)
BUN: 11 mg/dL (ref 6–23)
CO2: 28 meq/L (ref 19–32)
Calcium: 9.2 mg/dL (ref 8.4–10.5)
Chloride: 103 meq/L (ref 96–112)
Creatinine, Ser: 0.87 mg/dL (ref 0.40–1.50)
GFR: 124.97 mL/min (ref >60.00)
Glucose, Bld: 85 mg/dL (ref 70–99)
Potassium: 4.3 meq/L (ref 3.5–5.1)
Sodium: 137 meq/L (ref 135–145)
Total Bilirubin: 0.6 mg/dL (ref 0.2–1.2)
Total Protein: 7.2 g/dL (ref 6.0–8.3)

## 2023-03-18 LAB — LIPID PANEL
Cholesterol: 190 mg/dL (ref 0–200)
HDL: 47.3 mg/dL (ref >39.00)
LDL Cholesterol: 130 mg/dL — ABNORMAL HIGH (ref 0–99)
NonHDL: 142.83
Total CHOL/HDL Ratio: 4
Triglycerides: 64 mg/dL (ref 0.0–149.0)
VLDL: 12.8 mg/dL (ref 0.0–40.0)

## 2023-03-18 LAB — HEMOGLOBIN A1C: Hgb A1c MFr Bld: 5.2 % (ref 4.6–6.5)

## 2023-03-18 NOTE — Patient Instructions (Addendum)
 Welcome to [facility]!  We are checking labs today, will be in contact with any results that require further attention  Follow-up with me as needed.

## 2023-03-23 ENCOUNTER — Other Ambulatory Visit: Payer: Self-pay | Admitting: Gastroenterology

## 2023-03-23 ENCOUNTER — Ambulatory Visit (HOSPITAL_COMMUNITY)
Admission: RE | Admit: 2023-03-23 | Discharge: 2023-03-23 | Disposition: A | Discharge status: Home / Self Care | Payer: BC Managed Care – PPO | Source: Ambulatory Visit | Attending: Gastroenterology | Admitting: Gastroenterology

## 2023-03-23 DIAGNOSIS — R197 Diarrhea, unspecified: Secondary | ICD-10-CM | POA: Insufficient documentation

## 2023-03-23 DIAGNOSIS — Z8719 Personal history of other diseases of the digestive system: Secondary | ICD-10-CM | POA: Diagnosis present

## 2023-03-23 MED ORDER — GADOBUTROL 1 MMOL/ML IV SOLN
10.0000 mL | Freq: Once | INTRAVENOUS | Status: AC | PRN
  Administered 2023-03-23: 10 mL via INTRAVENOUS

## 2023-04-01 ENCOUNTER — Other Ambulatory Visit: Payer: Self-pay | Admitting: *Deleted

## 2023-04-01 DIAGNOSIS — Z8719 Personal history of other diseases of the digestive system: Secondary | ICD-10-CM

## 2023-04-06 ENCOUNTER — Other Ambulatory Visit (INDEPENDENT_AMBULATORY_CARE_PROVIDER_SITE_OTHER): Payer: BC Managed Care – PPO

## 2023-04-06 DIAGNOSIS — Z8719 Personal history of other diseases of the digestive system: Secondary | ICD-10-CM

## 2023-04-06 LAB — COMPREHENSIVE METABOLIC PANEL
ALT: 20 U/L (ref 0–53)
AST: 15 U/L (ref 0–37)
Albumin: 4.4 g/dL (ref 3.5–5.2)
Alkaline Phosphatase: 80 U/L (ref 52–171)
BUN: 7 mg/dL (ref 6–23)
CO2: 26 meq/L (ref 19–32)
Calcium: 9.3 mg/dL (ref 8.4–10.5)
Chloride: 104 meq/L (ref 96–112)
Creatinine, Ser: 0.94 mg/dL (ref 0.40–1.50)
GFR: 117.36 mL/min (ref >60.00)
Glucose, Bld: 93 mg/dL (ref 70–99)
Potassium: 4.6 meq/L (ref 3.5–5.1)
Sodium: 136 meq/L (ref 135–145)
Total Bilirubin: 0.4 mg/dL (ref 0.2–1.2)
Total Protein: 7.2 g/dL (ref 6.0–8.3)

## 2023-04-06 LAB — CBC WITH DIFFERENTIAL/PLATELET
Basophils Absolute: 0.1 10*3/uL (ref 0.0–0.1)
Basophils Relative: 1 % (ref 0.0–3.0)
Eosinophils Absolute: 0.1 10*3/uL (ref 0.0–0.7)
Eosinophils Relative: 1.5 % (ref 0.0–5.0)
HCT: 43.3 % (ref 36.0–49.0)
Hemoglobin: 14.6 g/dL (ref 12.0–16.0)
Lymphocytes Relative: 35.9 % (ref 24.0–48.0)
Lymphs Abs: 2.8 10*3/uL (ref 0.7–4.0)
MCHC: 33.8 g/dL (ref 31.0–37.0)
MCV: 83.4 fL (ref 78.0–98.0)
Monocytes Absolute: 0.4 10*3/uL (ref 0.1–1.0)
Monocytes Relative: 5.2 % (ref 3.0–12.0)
Neutro Abs: 4.3 10*3/uL (ref 1.4–7.7)
Neutrophils Relative %: 56.4 % (ref 43.0–71.0)
Platelets: 202 10*3/uL (ref 150.0–575.0)
RBC: 5.2 Mil/uL (ref 3.80–5.70)
RDW: 14.4 % (ref 11.4–15.5)
WBC: 7.7 10*3/uL (ref 4.5–13.5)

## 2023-04-06 LAB — LIPASE: Lipase: 10 U/L — ABNORMAL LOW (ref 11.0–59.0)

## 2023-04-08 LAB — ANA: Anti Nuclear Antibody (ANA): NEGATIVE

## 2023-04-14 ENCOUNTER — Telehealth: Payer: Self-pay | Admitting: Gastroenterology

## 2023-04-14 NOTE — Telephone Encounter (Signed)
 Called patient and informed him Dr. Melba recommendations based on his recent imaging showing persistent pancreatitis.  He has no symptoms at this time.  His lab work has been negative for autoimmune pancreatitis or other etiologies of his persistent pancreatitis.  Discussed next options for further workup including reimaging in a few months versus EUS with biopsy for further evaluation.  Discussed risks that come along with EUS including perforation, sedation, bleeding, infection, pancreatitis, and more.  Discussed we can proceed with a EUS with biopsy or we can wait a couple months and repeat imaging to evaluate his pancreas.  Patient appreciated the call and states he wanted to talk with his family first and call us  back and let us  know what he wants to do.

## 2023-06-16 ENCOUNTER — Encounter (INDEPENDENT_AMBULATORY_CARE_PROVIDER_SITE_OTHER): Payer: Self-pay

## 2023-06-29 ENCOUNTER — Encounter (INDEPENDENT_AMBULATORY_CARE_PROVIDER_SITE_OTHER): Payer: Self-pay

## 2023-10-08 ENCOUNTER — Encounter: Payer: Self-pay | Admitting: Family Medicine

## 2023-10-08 ENCOUNTER — Ambulatory Visit (INDEPENDENT_AMBULATORY_CARE_PROVIDER_SITE_OTHER): Admitting: Family Medicine

## 2023-10-08 VITALS — BP 132/80 | HR 63 | Temp 98.7°F | Ht 74.01 in | Wt 380.2 lb

## 2023-10-08 DIAGNOSIS — E66813 Obesity, class 3: Secondary | ICD-10-CM | POA: Diagnosis not present

## 2023-10-08 DIAGNOSIS — E782 Mixed hyperlipidemia: Secondary | ICD-10-CM | POA: Diagnosis not present

## 2023-10-08 DIAGNOSIS — I10 Essential (primary) hypertension: Secondary | ICD-10-CM

## 2023-10-08 DIAGNOSIS — Z0000 Encounter for general adult medical examination without abnormal findings: Secondary | ICD-10-CM | POA: Diagnosis not present

## 2023-10-08 NOTE — Progress Notes (Signed)
 Annual Physical Exam  Subjective:     Patient ID: Lee Haynes, male    DOB: [birth year], 18 y.o.   MRN: [id number]  Chief Complaint  Patient presents with   Annual Exam    HPI  Discussed the use of AI scribe software for clinical note transcription with the patient, who gave verbal consent to proceed.  History of Present Illness Lee Haynes is a 18 year old male who presents for an annual physical exam.  General health status - No current health issues - No symptoms of anxiety or stress upon arrival at the clinic  Neurological symptoms - No history of recent concussions despite participation in football and previous softball activities - No head injuries reported  Dental health - Recent dental visit - Scheduled for cavity removal  Ophthalmologic health - No recent eye examination - Received new glasses earlier this year     ROS Per HPI  Most recent fall risk assessment:     No data to display          Most recent depression screenings:    10/08/2023   11:11 AM 03/18/2023    1:08 PM  PHQ 2/9 Scores  PHQ - 2 Score 2 0  PHQ- 9 Score 6     Vision:Within last year and Dental: Receives regular dental care  Patient Care Team: Alvia Corean CROME, FNP as PCP - General (Family Medicine)   No outpatient medications prior to visit.   No facility-administered medications prior to visit.       Objective:    BP 132/80 (BP Location: Left Arm, Patient Position: Sitting, Cuff Size: Large)   Pulse 63   Temp 98.7 F (37.1 C) (Temporal)   Ht 6' 2.01 (1.88 m)   Wt (!) 380 lb 3.2 oz (172.5 kg)   SpO2 97%   BMI 48.80 kg/m    Physical Exam Vitals and nursing note reviewed.  Constitutional:      General: He is not in acute distress.    Appearance: Normal appearance.  HENT:     Head: Normocephalic and atraumatic.     Right Ear: External ear normal.     Left Ear: External ear normal.     Nose: Nose normal.     Mouth/Throat:     Mouth: Mucous  membranes are moist.     Pharynx: Oropharynx is clear.  Eyes:     Extraocular Movements: Extraocular movements intact.  Cardiovascular:     Rate and Rhythm: Normal rate and regular rhythm.     Pulses: Normal pulses.     Heart sounds: Normal heart sounds.  Pulmonary:     Effort: Pulmonary effort is normal. No respiratory distress.     Breath sounds: Normal breath sounds. No wheezing, rhonchi or rales.  Musculoskeletal:        General: Normal range of motion.     Cervical back: Normal range of motion.     Right lower leg: No edema.     Left lower leg: No edema.  Lymphadenopathy:     Cervical: No cervical adenopathy.  Skin:    General: Skin is warm and dry.  Neurological:     General: No focal deficit present.     Mental Status: He is alert and oriented to person, place, and time.  Psychiatric:        Mood and Affect: Mood normal.        Behavior: Behavior normal.     No results found for  any visits on 10/08/23.  BP Readings from Last 3 Encounters:  10/08/23 132/80  03/18/23 130/82  02/20/23 120/76   Wt Readings from Last 3 Encounters:  10/08/23 (!) 380 lb 3.2 oz (172.5 kg)  03/18/23 (!) 372 lb 9.6 oz (169 kg) (>99%, Z= 3.72)*  02/20/23 (!) 366 lb (166 kg) (>99%, Z= 3.67)*   * Growth percentiles are based on CDC (Boys, 2-20 Years) data.      Last CBC Lab Results  Component Value Date   WBC 7.7 04/06/2023   HGB 14.6 04/06/2023   HCT 43.3 04/06/2023   MCV 83.4 04/06/2023   MCH 27.5 02/19/2023   RDW 14.4 04/06/2023   PLT 202.0 04/06/2023   Last metabolic panel Lab Results  Component Value Date   GLUCOSE 93 04/06/2023   NA 136 04/06/2023   K 4.6 04/06/2023   CL 104 04/06/2023   CO2 26 04/06/2023   BUN 7 04/06/2023   CREATININE 0.94 04/06/2023   GFR 117.36 04/06/2023   CALCIUM 9.3 04/06/2023   PROT 7.2 04/06/2023   ALBUMIN 4.4 04/06/2023   BILITOT 0.4 04/06/2023   ALKPHOS 80 04/06/2023   AST 15 04/06/2023   ALT 20 04/06/2023   ANIONGAP 7 02/19/2023    Last hemoglobin A1c Lab Results  Component Value Date   HGBA1C 5.2 03/18/2023   Last vitamin D No results found for: 25OHVITD2, 25OHVITD3, VD25OH Last vitamin B12 and Folate No results found for: VITAMINB12, FOLATE       Assessment & Plan:   Assessment and Plan Assessment & Plan Adult Wellness Visit Routine visit with no new issues. Recent dental visit and eye doctor plans discussed. Immunization status reviewed; tetanus due next year. - Print immunization records for college submission. - College sports physical paperwork completed  - Schedule tetanus vaccine for next year.  Essential hypertension Blood pressure at 130/82 mmHg. Previous readings slightly elevated but acceptable. No concerns if below 140/90 mmHg. - DASH diet - Ensure blood pressure remains below 140/90 mmHg.  HLD - Decrease fats in the diet  Morbid Obesity - Discussed getting 150 mins of exercise per week - Continue efforts in healthy activity level     Health Maintenance  Topic Date Due   Meningitis B Vaccine (1 of 2 - Standard) Never done   Hepatitis C Screening  Never done   Hepatitis B Vaccine (1 of 3 - 19+ 3-dose series) Never done   COVID-19 Vaccine (1 - 2024-25 season) Never done   Flu Shot  10/09/2023   DTaP/Tdap/Td vaccine (7 - Td or Tdap) 07/03/2024   HPV Vaccine  Completed   HIV Screening  Completed     Discussed health benefits of physical activity, and encouraged him to engage in regular exercise appropriate for his age and condition.  No orders of the defined types were placed in this encounter.    No orders of the defined types were placed in this encounter.   Return in about 6 months (around 04/09/2024) for BP.  Corean LITTIE Ku, FNP

## 2023-10-08 NOTE — Patient Instructions (Signed)
 We have completed your physical today.   Follow-up with me in 6 mos for medication management, sooner if needed.
